# Patient Record
Sex: Male | Born: 1980 | Race: Black or African American | Hispanic: No | Marital: Single | Smoking: Former smoker
Health system: Southern US, Community
[De-identification: ages and names within clinical notes are randomized; demographics above are authoritative.]

## PROBLEM LIST (undated history)

## (undated) DIAGNOSIS — I10 Essential (primary) hypertension: Secondary | ICD-10-CM

## (undated) HISTORY — PX: DENTAL SURGERY: SHX609

---
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
## 2002-05-26 ENCOUNTER — Encounter: Payer: Self-pay | Admitting: Emergency Medicine

## 2002-05-26 ENCOUNTER — Emergency Department (HOSPITAL_COMMUNITY): Admission: EM | Admit: 2002-05-26 | Discharge: 2002-05-26 | Payer: Self-pay | Admitting: Emergency Medicine

## 2002-08-01 ENCOUNTER — Encounter: Payer: Self-pay | Admitting: Emergency Medicine

## 2002-08-01 ENCOUNTER — Emergency Department (HOSPITAL_COMMUNITY): Admission: EM | Admit: 2002-08-01 | Discharge: 2002-08-01 | Payer: Self-pay | Admitting: Emergency Medicine

## 2002-08-17 ENCOUNTER — Emergency Department (HOSPITAL_COMMUNITY): Admission: EM | Admit: 2002-08-17 | Discharge: 2002-08-18 | Payer: Self-pay | Admitting: Emergency Medicine

## 2007-03-02 ENCOUNTER — Emergency Department (HOSPITAL_COMMUNITY): Admission: EM | Admit: 2007-03-02 | Discharge: 2007-03-02 | Payer: Self-pay | Admitting: Emergency Medicine

## 2007-03-07 ENCOUNTER — Emergency Department (HOSPITAL_COMMUNITY): Admission: EM | Admit: 2007-03-07 | Discharge: 2007-03-07 | Payer: Self-pay | Admitting: Emergency Medicine

## 2007-11-20 ENCOUNTER — Emergency Department (HOSPITAL_COMMUNITY): Admission: EM | Admit: 2007-11-20 | Discharge: 2007-11-20 | Payer: Self-pay | Admitting: Pediatrics

## 2009-04-21 ENCOUNTER — Emergency Department (HOSPITAL_COMMUNITY): Admission: EM | Admit: 2009-04-21 | Discharge: 2009-04-21 | Payer: Self-pay | Admitting: Emergency Medicine

## 2009-04-26 ENCOUNTER — Emergency Department (HOSPITAL_COMMUNITY): Admission: EM | Admit: 2009-04-26 | Discharge: 2009-04-26 | Payer: Self-pay | Admitting: Emergency Medicine

## 2010-07-31 ENCOUNTER — Emergency Department (HOSPITAL_COMMUNITY)
Admission: EM | Admit: 2010-07-31 | Discharge: 2010-07-31 | Disposition: A | Discharge status: Home / Self Care | Payer: Self-pay | Attending: Emergency Medicine | Admitting: Emergency Medicine

## 2010-07-31 DIAGNOSIS — R221 Localized swelling, mass and lump, neck: Secondary | ICD-10-CM | POA: Insufficient documentation

## 2010-07-31 DIAGNOSIS — R220 Localized swelling, mass and lump, head: Secondary | ICD-10-CM | POA: Insufficient documentation

## 2010-07-31 DIAGNOSIS — E041 Nontoxic single thyroid nodule: Secondary | ICD-10-CM | POA: Insufficient documentation

## 2010-08-04 ENCOUNTER — Telehealth: Payer: Self-pay | Admitting: Endocrinology

## 2010-08-10 ENCOUNTER — Ambulatory Visit: Payer: Self-pay | Admitting: Endocrinology

## 2010-08-10 DIAGNOSIS — Z0289 Encounter for other administrative examinations: Secondary | ICD-10-CM

## 2010-08-10 NOTE — Progress Notes (Signed)
Summary: NEW ENDO CONSULT  MARCH 8  ---- Converted from flag ---- ---- 08/02/2010 5:18 PM, Minus Breeding MD wrote: ok  ---- 08/02/2010 1:34 PM, Hilarie Fredrickson wrote: Patrick Kennedy WENT TO THE ER AND WAS DX'D WITH A THYROID NODULE.  PT IS SELF PAY.  HE HAS AN APPT. TO EST WITH DR. Jonny Ruiz ON MAY 16 (HIS NEXT AVAIL NEW PT. SLOT) BUT WANT TO SEE AN ENDOCRINOLOGIST PRIOR TO THAT.  DO YOU WANT [place] TO SET HIM UP WITH YOUR NEXT AVAILABLE FOR A NEW PT, EVEN THOUGH HE WON'T SEE DR. JOHN UNTIL MAY?    PHONE: [phone] ------------------------------  Phone Note Call from Patient Call back at Home Phone [phone]   Caller: Patient

## 2010-09-06 LAB — URINE CULTURE
Colony Count: NO GROWTH
Culture: NO GROWTH

## 2010-09-06 LAB — URINALYSIS, ROUTINE W REFLEX MICROSCOPIC
Bilirubin Urine: NEGATIVE
Glucose, UA: NEGATIVE mg/dL
Hgb urine dipstick: NEGATIVE
Ketones, ur: NEGATIVE mg/dL
Nitrite: NEGATIVE
Protein, ur: NEGATIVE mg/dL
Specific Gravity, Urine: 1.02 (ref 1.005–1.030)
Urobilinogen, UA: 1 mg/dL (ref 0.0–1.0)
pH: 7.5 (ref 5.0–8.0)

## 2010-09-06 LAB — URINE MICROSCOPIC-ADD ON

## 2010-09-06 LAB — GC/CHLAMYDIA PROBE AMP, GENITAL: Chlamydia, DNA Probe: POSITIVE — AB

## 2010-09-20 ENCOUNTER — Emergency Department (HOSPITAL_COMMUNITY)
Admission: EM | Admit: 2010-09-20 | Discharge: 2010-09-20 | Disposition: A | Discharge status: Home / Self Care | Payer: Self-pay | Attending: Emergency Medicine | Admitting: Emergency Medicine

## 2010-09-20 DIAGNOSIS — Z202 Contact with and (suspected) exposure to infections with a predominantly sexual mode of transmission: Secondary | ICD-10-CM | POA: Insufficient documentation

## 2010-09-20 LAB — URINALYSIS, ROUTINE W REFLEX MICROSCOPIC
Ketones, ur: NEGATIVE mg/dL
Leukocytes, UA: NEGATIVE
Nitrite: NEGATIVE
Protein, ur: 30 mg/dL — AB
Urobilinogen, UA: 1 mg/dL (ref 0.0–1.0)

## 2010-10-18 ENCOUNTER — Ambulatory Visit: Payer: Self-pay | Admitting: Internal Medicine

## 2010-10-23 ENCOUNTER — Ambulatory Visit: Payer: Self-pay | Admitting: Internal Medicine

## 2010-10-23 DIAGNOSIS — Z0289 Encounter for other administrative examinations: Secondary | ICD-10-CM

## 2011-01-13 ENCOUNTER — Emergency Department (HOSPITAL_COMMUNITY)
Admission: EM | Admit: 2011-01-13 | Discharge: 2011-01-13 | Disposition: A | Discharge status: Home / Self Care | Payer: Self-pay | Attending: Emergency Medicine | Admitting: Emergency Medicine

## 2011-01-13 DIAGNOSIS — Z202 Contact with and (suspected) exposure to infections with a predominantly sexual mode of transmission: Secondary | ICD-10-CM | POA: Insufficient documentation

## 2011-01-13 DIAGNOSIS — R300 Dysuria: Secondary | ICD-10-CM | POA: Insufficient documentation

## 2011-03-15 LAB — GC/CHLAMYDIA PROBE AMP, GENITAL
Chlamydia, DNA Probe: NEGATIVE
GC Probe Amp, Genital: NEGATIVE

## 2011-03-15 LAB — RPR: RPR Ser Ql: NONREACTIVE

## 2011-10-19 ENCOUNTER — Encounter (HOSPITAL_COMMUNITY): Payer: Self-pay | Admitting: Emergency Medicine

## 2011-10-19 ENCOUNTER — Emergency Department (HOSPITAL_COMMUNITY): Payer: Self-pay

## 2011-10-19 ENCOUNTER — Emergency Department (HOSPITAL_COMMUNITY)
Admission: EM | Admit: 2011-10-19 | Discharge: 2011-10-19 | Discharge status: Against Medical Advice | Payer: Self-pay | Attending: Emergency Medicine | Admitting: Emergency Medicine

## 2011-10-19 DIAGNOSIS — T50904A Poisoning by unspecified drugs, medicaments and biological substances, undetermined, initial encounter: Secondary | ICD-10-CM | POA: Insufficient documentation

## 2011-10-19 DIAGNOSIS — R000 Tachycardia, unspecified: Secondary | ICD-10-CM | POA: Insufficient documentation

## 2011-10-19 DIAGNOSIS — T6591XA Toxic effect of unspecified substance, accidental (unintentional), initial encounter: Secondary | ICD-10-CM

## 2011-10-19 DIAGNOSIS — T50901A Poisoning by unspecified drugs, medicaments and biological substances, accidental (unintentional), initial encounter: Secondary | ICD-10-CM | POA: Insufficient documentation

## 2011-10-19 DIAGNOSIS — I10 Essential (primary) hypertension: Secondary | ICD-10-CM | POA: Insufficient documentation

## 2011-10-19 HISTORY — DX: Essential (primary) hypertension: I10

## 2011-10-19 MED ORDER — LORAZEPAM 2 MG/ML IJ SOLN: 1.0000 mg | Freq: Once | INTRAMUSCULAR | Status: DC

## 2011-10-19 MED ORDER — SODIUM CHLORIDE 0.9 % IV BOLUS (SEPSIS): 1000.0000 mL | Freq: Once | INTRAVENOUS | Status: DC

## 2011-10-19 NOTE — ED Notes (Addendum)
Patient states that the [employer] brought him here because they think he took narcotics and they want to recover the drugs. Patient denies N/V/D/F. Patient denies chest pain or SOB. Patient states his BP is elevated from tooth pain and drinking power drinks and talking "Man Up" pills. Patient placed on monitor and sats od 99% RA.  [employer] at bedside.

## 2011-10-19 NOTE — ED Notes (Signed)
Patient refuses to have blood drawn for lab tests and refuses IV fluids and medications. [employer] at bedside and patient is hancuffed to bed.

## 2011-10-19 NOTE — ED Notes (Signed)
Patient left AMA. [employer] released patient.

## 2011-10-19 NOTE — ED Notes (Signed)
Per [employer], pt swallowed unknown narcotic about 1.5 hours ago; [employer] escorted pt; pt ambulatory, and a&oX4; charge RN made aware of pt's arrival

## 2011-10-19 NOTE — ED Provider Notes (Signed)
History     CSN: [id number]  Arrival date & time 10/19/11  1702   First MD Initiated Contact with Patient 10/19/11 1807      Chief Complaint  Patient presents with  . Drug Overdose    (Consider location/radiation/quality/duration/timing/severity/associated sxs/prior treatment) Patient is a 31 y.o. male presenting with Overdose.  Drug Overdose This is a new problem. Episode onset: approx 3:30 pm. Episode frequency: once. The problem has been unchanged. Pertinent negatives include no abdominal pain, chest pain, coughing, fever, headaches, nausea, neck pain, numbness or vomiting. Associated symptoms comments: Tachycardia, hypertension. The symptoms are aggravated by nothing. He has tried nothing for the symptoms. The treatment provided no relief.    Past Medical History  Diagnosis Date  . Hypertension     Past Surgical History  Procedure Date  . Dental surgery     History reviewed. No pertinent family history.  History  Substance Use Topics  . Smoking status: Former Games developer  . Smokeless tobacco: Not on file  . Alcohol Use: No      Review of Systems  Constitutional: Negative for fever.  HENT: Negative for rhinorrhea, drooling and neck pain.   Eyes: Negative for pain.  Respiratory: Negative for cough and shortness of breath.   Cardiovascular: Negative for chest pain and leg swelling.  Gastrointestinal: Negative for nausea, vomiting, abdominal pain and diarrhea.  Genitourinary: Negative for dysuria and hematuria.  Musculoskeletal: Negative for gait problem.  Skin: Negative for color change.  Neurological: Negative for numbness and headaches.  Hematological: Negative for adenopathy.  Psychiatric/Behavioral: Negative for behavioral problems.  All other systems reviewed and are negative.    Allergies  Review of patient's allergies indicates no known allergies.  Home Medications   Current Outpatient Rx  Name Route Sig Dispense Refill  .  PSEUDOEPHEDRINE-GUAIFENESIN ER 60-600 MG PO TB12 Oral Take 1 tablet by mouth every 12 (twelve) hours.      BP 186/74  Pulse 145  Temp(Src) 100.7 F (38.2 C) (Oral)  Resp 18  SpO2 100%  Physical Exam  Constitutional: He is oriented to person, place, and time. He appears well-developed and well-nourished.  HENT:  Head: Normocephalic and atraumatic.  Right Ear: External ear normal.  Left Ear: External ear normal.  Nose: Nose normal.  Mouth/Throat: Oropharynx is clear and moist. No oropharyngeal exudate.  Eyes: Conjunctivae and EOM are normal. Pupils are equal, round, and reactive to light.  Neck: Normal range of motion. Neck supple.  Cardiovascular: Normal heart sounds and intact distal pulses.  Exam reveals no gallop and no friction rub.   No murmur heard.      Sinus tachycardia  Pulmonary/Chest: Effort normal and breath sounds normal. No respiratory distress. He has no wheezes.  Abdominal: Soft. Bowel sounds are normal. He exhibits no distension. There is no tenderness.  Musculoskeletal: Normal range of motion. He exhibits no edema and no tenderness.  Neurological: He is alert and oriented to person, place, and time.  Skin: Skin is warm and dry.  Psychiatric: He has a normal mood and affect. His behavior is normal.    ED Course  Procedures (including critical care time)   Labs Reviewed  CBC  DIFFERENTIAL  COMPREHENSIVE METABOLIC PANEL  PROTIME-INR  SALICYLATE LEVEL  ACETAMINOPHEN LEVEL  LACTIC ACID, PLASMA  URINE RAPID DRUG SCREEN (HOSP PERFORMED)   No results found.   No diagnosis found.   Date: 10/20/2011  Rate: 135  Rhythm: sinus tachycardia  QRS Axis: normal  Intervals: QT prolonged  ST/T  Wave abnormalities: nonspecific ST/T changes  Conduction Disutrbances:none  Narrative Interpretation: Flipped t waves in the inferior leads are non-specific  Old EKG Reviewed: none available    MDM  6:22 PM 31 y.o. male pw concern for drug ingestion. Pt was pulled  over by police. He then ran into a convenience store and was seen on the security camera ingesting something. The pt has no complaints, but is tachycardic in the 130's, hypertensive sys 170's on exam. Will get labs, imaging. Will give ativan. Pt does admit to using marijuana, cocaine in last few days. Denies ingestion or use of drugs today.   Pt wants to leave AMA. I notified him of the risks and consequences including death and permanent disability. Pt understands and would like to leave. I have discussed the diagnosis/risks/treatment options with the patient and believe the pt to be eligible for discharge home to follow-up with his pcp as needed. We also discussed returning to the ED immediately if new or worsening sx occur. We discussed the sx which are most concerning (e.g., sob, cp, continued palpitations) that necessitate immediate return. Any new prescriptions provided to the patient are listed below.  New Prescriptions   No medications on file   Clinical Impression 1. Ingestion of substance          Purvis Sheffield, MD 10/20/11 [phone]

## 2011-10-19 NOTE — ED Notes (Signed)
EkG handed to Dr. Bebe Shaggy.  Extra copy placed in pt chart, no old in MUSE

## 2011-10-21 NOTE — ED Provider Notes (Signed)
I have personally seen and examined the patient.  I have discussed the plan of care with the resident.  I have reviewed the documentation on PMH/FH/Soc. History.  I have reviewed the documentation of the resident and agree.  I have reviewed and agree with the ECG interpretation(s) documented by the resident.  Pt seen with resident, pt brought in by police but they are unable to hold him any longer Pt refuses workup, and he is mentating appropriately.  Risks of leaving discussed with patient  Joya Gaskins, MD 10/21/11 [phone]

## 2012-11-29 ENCOUNTER — Emergency Department (HOSPITAL_COMMUNITY)
Admission: EM | Admit: 2012-11-29 | Discharge: 2012-11-29 | Disposition: A | Discharge status: Home / Self Care | Payer: Self-pay | Attending: Emergency Medicine | Admitting: Emergency Medicine

## 2012-11-29 ENCOUNTER — Encounter (HOSPITAL_COMMUNITY): Payer: Self-pay | Admitting: Physical Medicine and Rehabilitation

## 2012-11-29 DIAGNOSIS — Z118 Encounter for screening for other infectious and parasitic diseases: Secondary | ICD-10-CM | POA: Insufficient documentation

## 2012-11-29 DIAGNOSIS — Z87891 Personal history of nicotine dependence: Secondary | ICD-10-CM | POA: Insufficient documentation

## 2012-11-29 DIAGNOSIS — Z202 Contact with and (suspected) exposure to infections with a predominantly sexual mode of transmission: Secondary | ICD-10-CM

## 2012-11-29 DIAGNOSIS — I10 Essential (primary) hypertension: Secondary | ICD-10-CM | POA: Insufficient documentation

## 2012-11-29 LAB — URINALYSIS, ROUTINE W REFLEX MICROSCOPIC
Hgb urine dipstick: NEGATIVE
Leukocytes, UA: NEGATIVE
Nitrite: NEGATIVE
Protein, ur: NEGATIVE mg/dL
Urobilinogen, UA: 1 mg/dL (ref 0.0–1.0)

## 2012-11-29 MED ORDER — LIDOCAINE HCL (PF) 1 % IJ SOLN
INTRAMUSCULAR | Status: AC
  Administered 2012-11-29: 0.9 mL
  Filled 2012-11-29: qty 5

## 2012-11-29 MED ORDER — AZITHROMYCIN 250 MG PO TABS
1000.0000 mg | ORAL_TABLET | Freq: Once | ORAL | Status: AC
  Administered 2012-11-29: 1000 mg via ORAL
  Filled 2012-11-29: qty 4

## 2012-11-29 MED ORDER — METRONIDAZOLE 500 MG PO TABS: 500.0000 mg | ORAL_TABLET | Freq: Two times a day (BID) | ORAL | Status: DC

## 2012-11-29 MED ORDER — CEFTRIAXONE SODIUM 250 MG IJ SOLR
250.0000 mg | Freq: Once | INTRAMUSCULAR | Status: AC
  Administered 2012-11-29: 250 mg via INTRAMUSCULAR
  Filled 2012-11-29: qty 250

## 2012-11-29 NOTE — ED Notes (Signed)
Pt presents to department for evaluation of possible STD. States he received phone call from partner stating that he could have STD. States burning with urination. Denies penile discharge. No abdominal pain.

## 2012-11-29 NOTE — ED Provider Notes (Signed)
History    CSN: [id number] Arrival date & time 11/29/12  1007  First MD Initiated Contact with Patient 11/29/12 1018     Chief Complaint  Patient presents with  . Dysuria  . SEXUALLY TRANSMITTED DISEASE   (Consider location/radiation/quality/duration/timing/severity/associated sxs/prior Treatment) HPI Comments: Patient presents with complaint of recent exposure to Trichomonas. Patient states that he has had some tingling with urination since he was told this. He denies discharge. He denies lesions, fever. He does have history of sexual transmitted disease. He is requesting testing and treatment. Onset of symptoms gradual. Course is constant. Nothing makes symptoms better or worse.  Patient is a 32 y.o. male presenting with dysuria. The history is provided by the patient.  Dysuria Pertinent negatives include no arthralgias, fever, rash or sore throat.   Past Medical History  Diagnosis Date  . Hypertension    Past Surgical History  Procedure Laterality Date  . Dental surgery     History reviewed. No pertinent family history. History  Substance Use Topics  . Smoking status: Former Games developer  . Smokeless tobacco: Not on file  . Alcohol Use: Yes     Comment: social    Review of Systems  Constitutional: Negative for fever.  HENT: Negative for sore throat.   Eyes: Negative for discharge.  Gastrointestinal: Negative for rectal pain.  Genitourinary: Positive for dysuria. Negative for frequency, discharge, genital sores, penile pain and testicular pain.  Musculoskeletal: Negative for arthralgias.  Skin: Negative for rash.  Hematological: Negative for adenopathy.    Allergies  Review of patient's allergies indicates no known allergies.  Home Medications   Current Outpatient Rx  Name  Route  Sig  Dispense  Refill  . pseudoephedrine-guaifenesin (MUCINEX D) 60-600 MG per tablet   Oral   Take 1 tablet by mouth every 12 (twelve) hours.          BP 142/83  Pulse 92   Temp(Src) 98.7 F (37.1 C) (Oral)  Resp 18  SpO2 100% Physical Exam  Nursing note and vitals reviewed. Constitutional: He appears well-developed and well-nourished.  HENT:  Head: Normocephalic and atraumatic.  Eyes: Conjunctivae are normal.  Neck: Normal range of motion. Neck supple.  Pulmonary/Chest: No respiratory distress.  Abdominal: There is no tenderness. There is no CVA tenderness.  Genitourinary: Testes normal and penis normal. Circumcised. No penile erythema. No discharge found.  Neurological: He is alert.  Skin: Skin is warm and dry.  Psychiatric: He has a normal mood and affect.    ED Course  Procedures (including critical care time) Labs Reviewed  GC/CHLAMYDIA PROBE AMP  URINALYSIS, ROUTINE W REFLEX MICROSCOPIC   No results found. 1. Exposure to STD     10:25 AM Patient seen and examined. Work-up initiated. Medications ordered.   Vital signs reviewed and are as follows: Filed Vitals:   11/29/12 1014  BP: 142/83  Pulse: 92  Temp: 98.7 F (37.1 C)  Resp: 18   UA does not show Trichomonas however patient treated. Will test and treat for gonorrhea/Chlamydia exposure at patient request. Patient counseled on safe sexual practices. Told them that they should not have sexual contact for next 7 days and that they need to inform sexual partners so that they can get tested and treated as well.  Urged f/u with [place] [facility] for HIV and syphilis testing. Patient verbalizes understanding and agrees with plan.     MDM  Exposure to Trichomonas. Patient has had some dysuria. UA negative. Patient treated for Trichomonas  and empirically for gonorrhea and Chlamydia. He appears well. Patient counseled and given health department followup.  Renne Crigler, PA-C 11/29/12 1535

## 2012-11-30 LAB — GC/CHLAMYDIA PROBE AMP
CT Probe RNA: NEGATIVE
GC Probe RNA: NEGATIVE

## 2012-12-02 NOTE — ED Provider Notes (Signed)
Medical screening examination/treatment/procedure(s) were performed by non-physician practitioner and as supervising physician I was immediately available for consultation/collaboration. Devoria Albe, MD, Armando Gang   Ward Givens, MD 12/02/12 [phone]

## 2013-09-07 ENCOUNTER — Other Ambulatory Visit: Payer: Self-pay

## 2017-07-24 ENCOUNTER — Encounter (HOSPITAL_COMMUNITY): Payer: Self-pay | Admitting: Family Medicine

## 2017-07-24 ENCOUNTER — Emergency Department (HOSPITAL_COMMUNITY)
Admission: EM | Admit: 2017-07-24 | Discharge: 2017-07-24 | Disposition: A | Discharge status: Home / Self Care | Payer: Self-pay | Attending: Emergency Medicine | Admitting: Emergency Medicine

## 2017-07-24 DIAGNOSIS — I10 Essential (primary) hypertension: Secondary | ICD-10-CM | POA: Insufficient documentation

## 2017-07-24 DIAGNOSIS — B9789 Other viral agents as the cause of diseases classified elsewhere: Secondary | ICD-10-CM | POA: Insufficient documentation

## 2017-07-24 DIAGNOSIS — R0981 Nasal congestion: Secondary | ICD-10-CM | POA: Insufficient documentation

## 2017-07-24 DIAGNOSIS — R059 Cough, unspecified: Secondary | ICD-10-CM

## 2017-07-24 DIAGNOSIS — J029 Acute pharyngitis, unspecified: Secondary | ICD-10-CM | POA: Insufficient documentation

## 2017-07-24 DIAGNOSIS — F17210 Nicotine dependence, cigarettes, uncomplicated: Secondary | ICD-10-CM | POA: Insufficient documentation

## 2017-07-24 DIAGNOSIS — J069 Acute upper respiratory infection, unspecified: Secondary | ICD-10-CM | POA: Insufficient documentation

## 2017-07-24 DIAGNOSIS — R05 Cough: Secondary | ICD-10-CM

## 2017-07-24 MED ORDER — IBUPROFEN 800 MG PO TABS: 800.0000 mg | ORAL_TABLET | Freq: Three times a day (TID) | ORAL | 0 refills | Status: DC

## 2017-07-24 MED ORDER — FLUTICASONE PROPIONATE 50 MCG/ACT NA SUSP: 2.0000 | Freq: Every day | NASAL | 2 refills | Status: DC

## 2017-07-24 MED ORDER — BENZONATATE 100 MG PO CAPS: 100.0000 mg | ORAL_CAPSULE | Freq: Three times a day (TID) | ORAL | 0 refills | Status: DC

## 2017-07-24 NOTE — ED Triage Notes (Signed)
Patient reports he has been experiencing chills, productive cough, sore throat, intermittent nasal drainage/congestion, and headache. Symptoms have been occurring for the last 2 weeks. Taking Nyquil with mild relief.

## 2017-07-24 NOTE — Discharge Instructions (Signed)

## 2017-07-24 NOTE — ED Provider Notes (Signed)
Emergency Department Provider Note   I have reviewed the triage vital signs and the nursing notes.   HISTORY  Chief Complaint URI   HPI Patrick Kennedy is a 37 y.o. male with PMH of HTN presents to the ED with with 2 weeks of cough, sore throat, runny nose, and fatigue. No sick contacts. Denies body aches. He is experiencing some sinus pressure as well. No radiation or symptoms or modifying. Symptoms are constant with no modifying factors. Denies any difficulty breathing. No vomiting or diarrhea.    Past Medical History:  Diagnosis Date  . Hypertension     There are no active problems to display for this patient.   Past Surgical History:  Procedure Laterality Date  . DENTAL SURGERY      Current Outpatient Rx  . Order #: [id number] Class: Historical Med  . Order #: [id number] Class: Print  . Order #: [id number] Class: Print  . Order #: [id number] Class: Print  . Order #: [id number] Class: Print    Allergies Patient has no known allergies.  History reviewed. No pertinent family history.  Social History Social History   Tobacco Use  . Smoking status: Current Every Day Smoker  . Smokeless tobacco: Never Used  Substance Use Topics  . Alcohol use: Yes    Comment: 4-5 times a week.   . Drug use: No    Comment: Hx    Review of Systems  Constitutional: No fever/chills. Positive fatigue.  Eyes: No visual changes. ENT: Positive sore throat. Positive face pressure.  Cardiovascular: Denies chest pain. Respiratory: Denies shortness of breath. Positive cough.  Gastrointestinal: No abdominal pain.  No nausea, no vomiting.  No diarrhea.  No constipation. Genitourinary: Negative for dysuria. Musculoskeletal: Negative for back pain. Skin: Negative for rash. Neurological: Negative for focal weakness or numbness. Positive mild HA.   10-point ROS otherwise negative.  ____________________________________________   PHYSICAL EXAM:  VITAL SIGNS: ED Triage Vitals  Enc  Vitals Group     BP 07/24/17 2208 (!) 149/82     Pulse Rate 07/24/17 2208 77     Resp 07/24/17 2208 18     Temp 07/24/17 2208 98.2 F (36.8 C)     Temp Source 07/24/17 2208 Oral     SpO2 07/24/17 2208 99 %     Weight 07/24/17 2213 220 lb (99.8 kg)     Height 07/24/17 2213 6\' 4"  (1.93 m)     Pain Score 07/24/17 2213 6   Constitutional: Alert and oriented. Well appearing and in no acute distress. Eyes: Conjunctivae are normal.  Head: Atraumatic. Nose: Mild congestion/rhinnorhea. No tenderness to palpation of the frontal or maxillary sinuses.  Mouth/Throat: Mucous membranes are moist.  Oropharynx non-erythematous. Neck: No stridor.   Cardiovascular: Normal rate, regular rhythm. Good peripheral circulation. Grossly normal heart sounds.   Respiratory: Normal respiratory effort.  No retractions. Lungs CTAB. Gastrointestinal: No distention.  Musculoskeletal: No gross deformities of extremities. Neurologic:  Normal speech and language. No gross focal neurologic deficits are appreciated.  Skin:  Skin is warm, dry and intact. No rash noted. ____________________________________________   PROCEDURES  Procedure(s) performed:   Procedures  None ____________________________________________   INITIAL IMPRESSION / ASSESSMENT AND PLAN / ED COURSE  Pertinent labs & imaging results that were available during my care of the patient were reviewed by me and considered in my medical decision making (see chart for details).  Patient presents to the ED for evaluation of nasal congestion, sore throat, and other URI symptoms. No flu clinically  and with 2 weeks of symptoms patient would not be a Tamiflu candidate. DIscussed supportive care and prescribed medication to assist with this.   At this time, I do not feel there is any life-threatening condition present. I have reviewed and discussed all results and exam findings with patient. I have reviewed nursing notes and appropriate previous records.  I  feel the patient is safe to be discharged home without further emergent workup. Discussed usual and customary return precautions. Patient verbalizes understanding and is comfortable with this plan.  Patient will follow-up with their primary care provider. If they do not have a primary care provider, information for follow-up has been provided to them. All questions have been answered.    ____________________________________________  FINAL CLINICAL IMPRESSION(S) / ED DIAGNOSES  Final diagnoses:  Viral upper respiratory tract infection  Cough    NEW OUTPATIENT MEDICATIONS STARTED DURING THIS VISIT:  Discharge Medication List as of 07/24/2017 10:44 PM    START taking these medications   Details  benzonatate (TESSALON) 100 MG capsule Take 1 capsule (100 mg total) by mouth every 8 (eight) hours., Starting Wed 07/24/2017, Print    fluticasone (FLONASE) 50 MCG/ACT nasal spray Place 2 sprays into both nostrils daily., Starting Wed 07/24/2017, Print    ibuprofen (ADVIL,MOTRIN) 800 MG tablet Take 1 tablet (800 mg total) by mouth 3 (three) times daily., Starting Wed 07/24/2017, Print        Note:  This document was prepared using Dragon voice recognition software and may include unintentional dictation errors.  Alona BeneJoshua Maicol Bowland, MD Emergency Medicine    Nickola Lenig, Arlyss RepressJoshua G, MD 07/25/17 [phone]

## 2017-09-02 ENCOUNTER — Encounter (HOSPITAL_COMMUNITY): Payer: Self-pay | Admitting: Emergency Medicine

## 2017-09-02 ENCOUNTER — Other Ambulatory Visit: Payer: Self-pay

## 2017-09-02 DIAGNOSIS — R112 Nausea with vomiting, unspecified: Secondary | ICD-10-CM | POA: Insufficient documentation

## 2017-09-02 DIAGNOSIS — R197 Diarrhea, unspecified: Secondary | ICD-10-CM | POA: Insufficient documentation

## 2017-09-02 DIAGNOSIS — Z87891 Personal history of nicotine dependence: Secondary | ICD-10-CM | POA: Insufficient documentation

## 2017-09-02 DIAGNOSIS — I10 Essential (primary) hypertension: Secondary | ICD-10-CM | POA: Insufficient documentation

## 2017-09-02 DIAGNOSIS — Z79899 Other long term (current) drug therapy: Secondary | ICD-10-CM | POA: Insufficient documentation

## 2017-09-02 NOTE — ED Triage Notes (Signed)
Pt c/o Abd pain x 24 hours + n/v/d. Multiple times in last 24 hours.

## 2017-09-03 ENCOUNTER — Emergency Department (HOSPITAL_COMMUNITY)
Admission: EM | Admit: 2017-09-03 | Discharge: 2017-09-03 | Disposition: A | Discharge status: Home / Self Care | Payer: Self-pay | Attending: Emergency Medicine | Admitting: Emergency Medicine

## 2017-09-03 DIAGNOSIS — R112 Nausea with vomiting, unspecified: Secondary | ICD-10-CM

## 2017-09-03 DIAGNOSIS — R197 Diarrhea, unspecified: Secondary | ICD-10-CM

## 2017-09-03 LAB — COMPREHENSIVE METABOLIC PANEL
ALT: 19 U/L (ref 17–63)
AST: 18 U/L (ref 15–41)
Albumin: 3.9 g/dL (ref 3.5–5.0)
Alkaline Phosphatase: 48 U/L (ref 38–126)
Anion gap: 10 (ref 5–15)
BILIRUBIN TOTAL: 0.8 mg/dL (ref 0.3–1.2)
BUN: 10 mg/dL (ref 6–20)
CALCIUM: 8.9 mg/dL (ref 8.9–10.3)
CO2: 24 mmol/L (ref 22–32)
CREATININE: 1.17 mg/dL (ref 0.61–1.24)
Chloride: 105 mmol/L (ref 101–111)
Glucose, Bld: 86 mg/dL (ref 65–99)
Potassium: 3.5 mmol/L (ref 3.5–5.1)
Sodium: 139 mmol/L (ref 135–145)
TOTAL PROTEIN: 7.2 g/dL (ref 6.5–8.1)

## 2017-09-03 LAB — URINALYSIS, ROUTINE W REFLEX MICROSCOPIC
Bilirubin Urine: NEGATIVE
GLUCOSE, UA: NEGATIVE mg/dL
Hgb urine dipstick: NEGATIVE
KETONES UR: NEGATIVE mg/dL
LEUKOCYTES UA: NEGATIVE
NITRITE: NEGATIVE
PROTEIN: NEGATIVE mg/dL
Specific Gravity, Urine: 1.031 — ABNORMAL HIGH (ref 1.005–1.030)
pH: 5 (ref 5.0–8.0)

## 2017-09-03 LAB — CBC
HCT: 47.7 % (ref 39.0–52.0)
Hemoglobin: 15.9 g/dL (ref 13.0–17.0)
MCH: 29.8 pg (ref 26.0–34.0)
MCHC: 33.3 g/dL (ref 30.0–36.0)
MCV: 89.5 fL (ref 78.0–100.0)
PLATELETS: 209 10*3/uL (ref 150–400)
RBC: 5.33 MIL/uL (ref 4.22–5.81)
RDW: 12.8 % (ref 11.5–15.5)
WBC: 4.7 10*3/uL (ref 4.0–10.5)

## 2017-09-03 LAB — LIPASE, BLOOD: Lipase: 41 U/L (ref 11–51)

## 2017-09-03 MED ORDER — ONDANSETRON 4 MG PO TBDP: 4.0000 mg | ORAL_TABLET | Freq: Three times a day (TID) | ORAL | 0 refills | Status: DC | PRN

## 2017-09-03 NOTE — ED Notes (Signed)
Patient Alert and oriented to baseline. Stable and ambulatory to baseline. Patient verbalized understanding of the discharge instructions.  Patient belongings were taken by the patient.   

## 2017-09-03 NOTE — ED Provider Notes (Signed)
[facility] EMERGENCY DEPARTMENT Provider Note   CSN: [id number] Arrival date & time: 09/02/17  2318     History   Chief Complaint Chief Complaint  Patient presents with  . Abdominal Pain    HPI Patrick Kennedy is a 37 y.o. male.  Patient presents to the emergency department with a chief complaint of nausea, vomiting, diarrhea.  He reports associated crampy abdominal pain.  He states the symptoms started 2 days ago.  He denies any fevers or chills.  He has not taken anything for his symptoms.  He reports associated sick contacts.  He denies any other associated symptoms.  The history is provided by the patient. No language interpreter was used.    Past Medical History:  Diagnosis Date  . Hypertension     There are no active problems to display for this patient.   Past Surgical History:  Procedure Laterality Date  . DENTAL SURGERY          Home Medications    Prior to Admission medications   Medication Sig Start Date End Date Taking? Authorizing Provider  benzonatate (TESSALON) 100 MG capsule Take 1 capsule (100 mg total) by mouth every 8 (eight) hours. 07/24/17   Long, Arlyss Repress, MD  DM-Doxylamine-Acetaminophen (NYQUIL COLD & FLU PO) Take 10 mLs by mouth at bedtime as needed.    [provider]  fluticasone (FLONASE) 50 MCG/ACT nasal spray Place 2 sprays into both nostrils daily. 07/24/17   Long, Arlyss Repress, MD  ibuprofen (ADVIL,MOTRIN) 800 MG tablet Take 1 tablet (800 mg total) by mouth 3 (three) times daily. 07/24/17   Long, Arlyss Repress, MD  metroNIDAZOLE (FLAGYL) 500 MG tablet Take 1 tablet (500 mg total) by mouth 2 (two) times daily. Patient not taking: Reported on 07/24/2017 11/29/12   Renne Crigler, PA-C  ondansetron (ZOFRAN ODT) 4 MG disintegrating tablet Take 1 tablet (4 mg total) by mouth every 8 (eight) hours as needed for nausea or vomiting. 09/03/17   Roxy Horseman, PA-C    Family History No family history on file.  Social  History Social History   Tobacco Use  . Smoking status: Former Smoker    Last attempt to quit: 07/29/2017    Years since quitting: 0.0  . Smokeless tobacco: Never Used  Substance Use Topics  . Alcohol use: Yes    Comment: 4-5 times a week.   . Drug use: No     Allergies   Patient has no known allergies.   Review of Systems Review of Systems  All other systems reviewed and are negative.    Physical Exam Updated Vital Signs BP 122/69   Pulse 73   Temp 98.5 F (36.9 C)   Resp 17   Ht 6\' 4"  (1.93 m)   Wt 98.4 kg (217 lb)   SpO2 100%   BMI 26.41 kg/m   Physical Exam  Constitutional: He is oriented to person, place, and time. He appears well-developed and well-nourished.  HENT:  Head: Normocephalic and atraumatic.  Eyes: Pupils are equal, round, and reactive to light. Conjunctivae and EOM are normal. Right eye exhibits no discharge. Left eye exhibits no discharge. No scleral icterus.  Neck: Normal range of motion. Neck supple. No JVD present.  Cardiovascular: Normal rate, regular rhythm and normal heart sounds. Exam reveals no gallop and no friction rub.  No murmur heard. Pulmonary/Chest: Effort normal and breath sounds normal. No respiratory distress. He has no wheezes. He has no rales. He exhibits  no tenderness.  Abdominal: Soft. He exhibits no distension and no mass. There is no tenderness. There is no rebound and no guarding.  No focal abdominal tenderness, no RLQ tenderness or pain at McBurney's point, no RUQ tenderness or Murphy's sign, no left-sided abdominal tenderness, no fluid wave, or signs of peritonitis   Musculoskeletal: Normal range of motion. He exhibits no edema or tenderness.  Neurological: He is alert and oriented to person, place, and time.  Skin: Skin is warm and dry.  Psychiatric: He has a normal mood and affect. His behavior is normal. Judgment and thought content normal.  Nursing note and vitals reviewed.    ED Treatments / Results   Labs (all labs ordered are listed, but only abnormal results are displayed) Labs Reviewed  URINALYSIS, ROUTINE W REFLEX MICROSCOPIC - Abnormal; Notable for the following components:      Result Value   Specific Gravity, Urine 1.031 (*)    All other components within normal limits  LIPASE, BLOOD  COMPREHENSIVE METABOLIC PANEL  CBC    EKG None  Radiology No results found.  Procedures Procedures (including critical care time)  Medications Ordered in ED Medications - No data to display   Initial Impression / Assessment and Plan / ED Course  I have reviewed the triage vital signs and the nursing notes.  Pertinent labs & imaging results that were available during my care of the patient were reviewed by me and considered in my medical decision making (see chart for details).     Patient with nausea, vomiting, diarrhea.  He also reports crampy abdominal pain times 2 days.  He has been around other sick contacts with similar symptoms.  His abdominal exam is benign, he has no focal abdominal tenderness.  Doubt surgical or acute abdomen.  Laboratory workup is reassuring.  His vital signs are normal.  He is well-appearing.  He would like to be discharged with a work note and something for nausea.  Final Clinical Impressions(s) / ED Diagnoses   Final diagnoses:  Nausea vomiting and diarrhea    ED Discharge Orders        Ordered    ondansetron (ZOFRAN ODT) 4 MG disintegrating tablet  Every 8 hours PRN     09/03/17 0203       Roxy HorsemanBrowning, Lanier Felty, PA-C 09/03/17 0206    Dione BoozeGlick, David, MD 09/03/17 [phone]

## 2020-06-08 ENCOUNTER — Emergency Department (HOSPITAL_COMMUNITY)
Admission: EM | Admit: 2020-06-08 | Discharge: 2020-06-09 | Disposition: A | Discharge status: Home / Self Care | Payer: Self-pay | Attending: Emergency Medicine | Admitting: Emergency Medicine

## 2020-06-08 ENCOUNTER — Encounter (HOSPITAL_COMMUNITY): Payer: Self-pay | Admitting: *Deleted

## 2020-06-08 ENCOUNTER — Other Ambulatory Visit: Payer: Self-pay

## 2020-06-08 DIAGNOSIS — Z87891 Personal history of nicotine dependence: Secondary | ICD-10-CM | POA: Insufficient documentation

## 2020-06-08 DIAGNOSIS — I10 Essential (primary) hypertension: Secondary | ICD-10-CM | POA: Insufficient documentation

## 2020-06-08 DIAGNOSIS — J069 Acute upper respiratory infection, unspecified: Secondary | ICD-10-CM | POA: Insufficient documentation

## 2020-06-08 DIAGNOSIS — R072 Precordial pain: Secondary | ICD-10-CM | POA: Insufficient documentation

## 2020-06-08 DIAGNOSIS — Z20822 Contact with and (suspected) exposure to covid-19: Secondary | ICD-10-CM | POA: Insufficient documentation

## 2020-06-08 NOTE — ED Triage Notes (Signed)
Has not had vaccines

## 2020-06-08 NOTE — ED Triage Notes (Signed)
The pt is c/o a cold cough chest pain sorethroat for 2 weeks

## 2020-06-09 ENCOUNTER — Emergency Department (HOSPITAL_COMMUNITY): Payer: Self-pay

## 2020-06-09 LAB — BASIC METABOLIC PANEL
Anion gap: 12 (ref 5–15)
BUN: 10 mg/dL (ref 6–20)
CO2: 24 mmol/L (ref 22–32)
Calcium: 9.5 mg/dL (ref 8.9–10.3)
Chloride: 101 mmol/L (ref 98–111)
Creatinine, Ser: 1.06 mg/dL (ref 0.61–1.24)
GFR, Estimated: >60 mL/min (ref >60)
Glucose, Bld: 106 mg/dL — ABNORMAL HIGH (ref 70–99)
Potassium: 3.6 mmol/L (ref 3.5–5.1)
Sodium: 137 mmol/L (ref 135–145)

## 2020-06-09 LAB — SARS CORONAVIRUS 2 (TAT 6-24 HRS): SARS Coronavirus 2: NEGATIVE

## 2020-06-09 LAB — TROPONIN I (HIGH SENSITIVITY): Troponin I (High Sensitivity): 4 ng/L (ref <18)

## 2020-06-09 LAB — CBC WITH DIFFERENTIAL/PLATELET
Abs Immature Granulocytes: 0.02 10*3/uL (ref 0.00–0.07)
Basophils Absolute: 0.1 10*3/uL (ref 0.0–0.1)
Basophils Relative: 1 %
Eosinophils Absolute: 0.1 10*3/uL (ref 0.0–0.5)
Eosinophils Relative: 1 %
HCT: 48.6 % (ref 39.0–52.0)
Hemoglobin: 15.8 g/dL (ref 13.0–17.0)
Immature Granulocytes: 0 %
Lymphocytes Relative: 28 %
Lymphs Abs: 2 10*3/uL (ref 0.7–4.0)
MCH: 28.8 pg (ref 26.0–34.0)
MCHC: 32.5 g/dL (ref 30.0–36.0)
MCV: 88.5 fL (ref 80.0–100.0)
Monocytes Absolute: 0.6 10*3/uL (ref 0.1–1.0)
Monocytes Relative: 8 %
Neutro Abs: 4.5 10*3/uL (ref 1.7–7.7)
Neutrophils Relative %: 62 %
Platelets: 278 10*3/uL (ref 150–400)
RBC: 5.49 MIL/uL (ref 4.22–5.81)
RDW: 12.2 % (ref 11.5–15.5)
WBC: 7.2 10*3/uL (ref 4.0–10.5)
nRBC: 0 % (ref 0.0–0.2)

## 2020-06-09 LAB — POC SARS CORONAVIRUS 2 AG -  ED: SARS Coronavirus 2 Ag: NEGATIVE

## 2020-06-09 MED ORDER — IBUPROFEN 800 MG PO TABS: 800.0000 mg | ORAL_TABLET | Freq: Three times a day (TID) | ORAL | 0 refills | Status: DC | PRN

## 2020-06-09 MED ORDER — ALBUTEROL SULFATE HFA 108 (90 BASE) MCG/ACT IN AERS: 1.0000 | INHALATION_SPRAY | Freq: Four times a day (QID) | RESPIRATORY_TRACT | 0 refills | Status: DC | PRN

## 2020-06-09 MED ORDER — BENZONATATE 100 MG PO CAPS: 100.0000 mg | ORAL_CAPSULE | Freq: Three times a day (TID) | ORAL | 0 refills | Status: DC | PRN

## 2020-06-09 NOTE — Discharge Instructions (Signed)
You were seen in the emergency room today with chest discomfort and upper respiratory infection symptoms.  We are testing you for COVID-19 with a PCR test.  The results will come back in the MyChart app and 6 to 24 hours.  Please remain in quarantine until your test comes back negative and you are feeling better for at least 2 days.  You can use the medications called into your pharmacy to help with symptoms.  If you develop new or worsening chest pain you should return to the emergency department, otherwise, please follow with your primary care doctor who can manage and refer her to a specialist if needed.

## 2020-06-09 NOTE — ED Provider Notes (Signed)
Emergency Department Provider Note   I have reviewed the triage vital signs and the nursing notes.   HISTORY  Chief Complaint Chest Pain   HPI Patrick Kennedy is a 40 y.o. male with past medical history of hypertension presents to the emergency department with cough, congestion, sore throat intermittently over the past 2 weeks.  He has had some developing chest discomfort the last several days which is concerning him.  He does have pain with coughing but also having some more constant chest pressure/tightness.   No clear modifying factors.  Pain is centered to left-sided and intermittently worse.  No diaphoresis.  No syncope.  Patient has not been tested for Covid. Patient not vaccinated.   Past Medical History:  Diagnosis Date  . Hypertension     There are no problems to display for this patient.   Past Surgical History:  Procedure Laterality Date  . DENTAL SURGERY      Allergies Patient has no known allergies.  No family history on file.  Social History Social History   Tobacco Use  . Smoking status: Former Smoker    Quit date: 07/29/2017    Years since quitting: 2.8  . Smokeless tobacco: Never Used  Vaping Use  . Vaping Use: Never used  Substance Use Topics  . Alcohol use: Yes    Comment: 4-5 times a week.   . Drug use: No    Review of Systems  Constitutional: Subjective fever and body aches w/ fatigue.  Eyes: No visual changes. ENT: Positive sore throat. Cardiovascular: Positive chest pain. Respiratory: Denies shortness of breath. Positive cough.  Gastrointestinal: No abdominal pain.  No nausea, no vomiting.  No diarrhea.  No constipation. Genitourinary: Negative for dysuria. Musculoskeletal: Negative for back pain. Skin: Negative for rash. Neurological: Negative for focal weakness or numbness. Positive HA.   10-point ROS otherwise negative.  ____________________________________________   PHYSICAL EXAM:  VITAL SIGNS: ED Triage Vitals   Enc Vitals Group     BP 06/08/20 2249 (!) 169/121     Pulse Rate 06/08/20 2249 99     Resp 06/08/20 2249 16     Temp 06/08/20 2249 99 F (37.2 C)     Temp Source 06/08/20 2249 Oral     SpO2 06/08/20 2249 97 %     Weight 06/08/20 2341 220 lb (99.8 kg)     Height 06/08/20 2341 6\' 4"  (1.93 m)   Constitutional: Alert and oriented. Well appearing and in no acute distress. Eyes: Conjunctivae are slightly injected bilaterally. No exudate.  Head: Atraumatic. Nose: Positive congestion/rhinnorhea. Mouth/Throat: Mucous membranes are moist.   Neck: No stridor.   Cardiovascular: Normal rate, regular rhythm. Good peripheral circulation. Grossly normal heart sounds.   Respiratory: Normal respiratory effort.  No retractions. Lungs CTAB. Gastrointestinal: Soft and nontender. No distention.  Musculoskeletal: No gross deformities of extremities. Neurologic:  Normal speech and language.  Skin:  Skin is warm, dry and intact. No rash noted.  ____________________________________________   LABS (all labs ordered are listed, but only abnormal results are displayed)  Labs Reviewed  BASIC METABOLIC PANEL - Abnormal; Notable for the following components:      Result Value   Glucose, Bld 106 (*)    All other components within normal limits  SARS CORONAVIRUS 2 (TAT 6-24 HRS)  CBC WITH DIFFERENTIAL/PLATELET  POC SARS CORONAVIRUS 2 AG -  ED  TROPONIN I (HIGH SENSITIVITY)  TROPONIN I (HIGH SENSITIVITY)   ____________________________________________  EKG   EKG Interpretation  Date/Time:  Wednesday June 08 2020 22:43:47 EST Ventricular Rate:  97 PR Interval:  144 QRS Duration: 84 QT Interval:  346 QTC Calculation: 439 R Axis:   88 Text Interpretation: Normal sinus rhythm Normal ECG No STEMI Confirmed by Alona Bene [phone]) on 06/09/2020 10:06:08 AM       ____________________________________________  RADIOLOGY  DG Chest 2 View  Result Date: 06/09/2020 CLINICAL DATA:  Cough and chest  pain x2 weeks. EXAM: CHEST - 2 VIEW COMPARISON:  None. FINDINGS: The heart size and mediastinal contours are within normal limits. Both lungs are clear. The visualized skeletal structures are unremarkable. IMPRESSION: No active cardiopulmonary disease. Electronically Signed   By: Aram Candela M.D.   On: 06/09/2020 00:10    ____________________________________________   PROCEDURES  Procedure(s) performed:   Procedures  None  ____________________________________________   INITIAL IMPRESSION / ASSESSMENT AND PLAN / ED COURSE  Pertinent labs & imaging results that were available during my care of the patient were reviewed by me and considered in my medical decision making (see chart for details).   Patient presents to the emergency department with cough, congestion, sore throat.  He is having some associated central chest pressure which does not seem associated with his coughing.  Suspect URI primarily, possibly Covid.  Will send antigen testing initially and if negative plan for PCR.  Patient is unvaccinated.  He is not hypoxic.  Doubt PE.  ACS lower on my differential but with his constant chest pressure do feel he warrants labs including troponin. PERC negative.   Labs reviewed. No acute findings. COVID PCR sent and patient to remain in quarantine pending test results per CDC guidelines.  Discussed PCP follow-up plan to reassess symptoms and provide additional referrals and/or treatment options as needed. ____________________________________________  FINAL CLINICAL IMPRESSION(S) / ED DIAGNOSES  Final diagnoses:  Precordial chest pain  Upper respiratory tract infection, unspecified type    NEW OUTPATIENT MEDICATIONS STARTED DURING THIS VISIT:  Discharge Medication List as of 06/09/2020 12:19 PM    START taking these medications   Details  albuterol (VENTOLIN HFA) 108 (90 Base) MCG/ACT inhaler Inhale 1-2 puffs into the lungs every 6 (six) hours as needed for wheezing or  shortness of breath., Starting Thu 06/09/2020, Normal        Note:  This document was prepared using Dragon voice recognition software and may include unintentional dictation errors.  Alona Bene, MD, [facility]    Baneen Wieseler, Arlyss Repress, MD 06/10/20 [id number]

## 2020-09-08 ENCOUNTER — Encounter (HOSPITAL_COMMUNITY): Payer: Self-pay | Admitting: Emergency Medicine

## 2020-09-08 ENCOUNTER — Ambulatory Visit (HOSPITAL_COMMUNITY)
Admission: EM | Admit: 2020-09-08 | Discharge: 2020-09-08 | Disposition: A | Discharge status: Home / Self Care | Payer: 59 | Attending: Internal Medicine | Admitting: Internal Medicine

## 2020-09-08 ENCOUNTER — Other Ambulatory Visit: Payer: Self-pay

## 2020-09-08 DIAGNOSIS — Z79899 Other long term (current) drug therapy: Secondary | ICD-10-CM | POA: Insufficient documentation

## 2020-09-08 DIAGNOSIS — R059 Cough, unspecified: Secondary | ICD-10-CM | POA: Diagnosis present

## 2020-09-08 DIAGNOSIS — Z87891 Personal history of nicotine dependence: Secondary | ICD-10-CM | POA: Diagnosis not present

## 2020-09-08 DIAGNOSIS — R439 Unspecified disturbances of smell and taste: Secondary | ICD-10-CM | POA: Insufficient documentation

## 2020-09-08 DIAGNOSIS — R430 Anosmia: Secondary | ICD-10-CM | POA: Diagnosis not present

## 2020-09-08 DIAGNOSIS — J309 Allergic rhinitis, unspecified: Secondary | ICD-10-CM

## 2020-09-08 DIAGNOSIS — R0981 Nasal congestion: Secondary | ICD-10-CM | POA: Diagnosis not present

## 2020-09-08 DIAGNOSIS — Z20822 Contact with and (suspected) exposure to covid-19: Secondary | ICD-10-CM | POA: Diagnosis not present

## 2020-09-08 MED ORDER — SALINE SPRAY 0.65 % NA SOLN: 1.0000 | NASAL | 0 refills | Status: DC | PRN

## 2020-09-08 MED ORDER — GUAIFENESIN ER 600 MG PO TB12: 600.0000 mg | ORAL_TABLET | Freq: Two times a day (BID) | ORAL | 0 refills | Status: AC

## 2020-09-08 MED ORDER — CETIRIZINE HCL 10 MG PO TABS: 10.0000 mg | ORAL_TABLET | Freq: Every day | ORAL | 0 refills | Status: DC

## 2020-09-08 NOTE — ED Triage Notes (Signed)
Pt presents with sinus congestion, cough, and "nasty taste" in mouth. Has been taking Flonase and Claritin daily.

## 2020-09-08 NOTE — ED Provider Notes (Signed)
[facility]    CSN: [id number] Arrival date & time: 09/08/20  1847      History   Chief Complaint Chief Complaint  Patient presents with  . Nasal Congestion  . Cough  . Loss of Taste    HPI Patrick Kennedy is a 40 y.o. male comes to the urgent care with sinus congestion, cough and a nasty taste in the mouth.  Patient was seen for acute sinusitis about a couple of months ago.  He was prescribed Flonase and Claritin as well as some Afrin.  Patient has been using Flonase consistently and has been intermittently using Afrin and Claritin.  Patient symptoms improved but has recurred.  He denies any seasonal allergies.  No fever or chills.  Nasal discharge was initially yellowish but has recently cleared up.  No facial pain.  Patient describes loss of taste and smell.  Patient denies any fever or generalized body aches.  No nausea or vomiting.  No sick contacts.  Patient is not vaccinated against COVID-19 virus.  Patient's cough is nonproductive.  Cough is mild without shortness of breath or wheezing.  Patient does not use a humidifier at home.  HPI  Past Medical History:  Diagnosis Date  . Hypertension     There are no problems to display for this patient.   Past Surgical History:  Procedure Laterality Date  . DENTAL SURGERY         Home Medications    Prior to Admission medications   Medication Sig Start Date End Date Taking? Authorizing Provider  cetirizine (ZYRTEC ALLERGY) 10 MG tablet Take 1 tablet (10 mg total) by mouth daily. 09/08/20  Yes Jairus Tonne, Britta Mccreedy, MD  guaiFENesin (MUCINEX) 600 MG 12 hr tablet Take 1 tablet (600 mg total) by mouth 2 (two) times daily for 14 days. 09/08/20 09/22/20 Yes Rechel Delosreyes, Britta Mccreedy, MD  sodium chloride (OCEAN) 0.65 % SOLN nasal spray Place 1 spray into both nostrils as needed for congestion. 09/08/20  Yes Arlind Klingerman, Britta Mccreedy, MD  albuterol (VENTOLIN HFA) 108 (90 Base) MCG/ACT inhaler Inhale 1-2 puffs into the lungs every 6 (six)  hours as needed for wheezing or shortness of breath. 06/09/20   Long, Arlyss Repress, MD  benzonatate (TESSALON) 100 MG capsule Take 1 capsule (100 mg total) by mouth 3 (three) times daily as needed for cough. 06/09/20   Long, Arlyss Repress, MD  DM-Doxylamine-Acetaminophen (NYQUIL COLD & FLU PO) Take 10 mLs by mouth at bedtime as needed.    [provider]  fluticasone (FLONASE) 50 MCG/ACT nasal spray Place 2 sprays into both nostrils daily. 07/24/17   Long, Arlyss Repress, MD  ibuprofen (ADVIL) 800 MG tablet Take 1 tablet (800 mg total) by mouth every 8 (eight) hours as needed for moderate pain. 06/09/20   Long, Arlyss Repress, MD  ondansetron (ZOFRAN ODT) 4 MG disintegrating tablet Take 1 tablet (4 mg total) by mouth every 8 (eight) hours as needed for nausea or vomiting. 09/03/17   Roxy Horseman, PA-C    Family History Family History  Problem Relation Age of Onset  . Hypertension Mother   . Hypertension Father     Social History Social History   Tobacco Use  . Smoking status: Former Smoker    Quit date: 07/29/2017    Years since quitting: 3.1  . Smokeless tobacco: Never Used  Vaping Use  . Vaping Use: Never used  Substance Use Topics  . Alcohol use: Yes    Comment: 4-5 times  a week.   . Drug use: No     Allergies   Patient has no known allergies.   Review of Systems Review of Systems  HENT: Positive for congestion. Negative for ear pain, facial swelling, sinus pressure, sinus pain, sore throat and voice change.   Respiratory: Positive for cough. Negative for shortness of breath and wheezing.   Cardiovascular: Negative for chest pain and palpitations.  Neurological: Negative.      Physical Exam Triage Vital Signs ED Triage Vitals  Enc Vitals Group     BP 09/08/20 1905 (!) 153/118     Pulse Rate 09/08/20 1905 100     Resp 09/08/20 1905 17     Temp 09/08/20 1905 98.8 F (37.1 C)     Temp Source 09/08/20 1905 Oral     SpO2 09/08/20 1905 97 %     Weight --      Height --      Head  Circumference --      Peak Flow --      Pain Score 09/08/20 1902 0     Pain Loc --      Pain Edu? --      Excl. in [place]? --    No data found.  Updated Vital Signs BP (!) 153/118 (BP Location: Right Arm)   Pulse 100   Temp 98.8 F (37.1 C) (Oral)   Resp 17   SpO2 97%   Visual Acuity Right Eye Distance:   Left Eye Distance:   Bilateral Distance:    Right Eye Near:   Left Eye Near:    Bilateral Near:     Physical Exam Vitals and nursing note reviewed.  Constitutional:      General: He is not in acute distress.    Appearance: He is not ill-appearing.  HENT:     Right Ear: Tympanic membrane normal.     Left Ear: Tympanic membrane normal.     Nose: Congestion present.     Mouth/Throat:     Pharynx: No posterior oropharyngeal erythema.  Eyes:     Extraocular Movements: Extraocular movements intact.     Pupils: Pupils are equal, round, and reactive to light.  Cardiovascular:     Rate and Rhythm: Normal rate and regular rhythm.     Pulses: Normal pulses.     Heart sounds: Normal heart sounds.  Pulmonary:     Effort: Pulmonary effort is normal.     Breath sounds: Normal breath sounds.  Musculoskeletal:     Cervical back: Normal range of motion. No rigidity or tenderness.  Lymphadenopathy:     Cervical: No cervical adenopathy.  Neurological:     Mental Status: He is alert.      UC Treatments / Results  Labs (all labs ordered are listed, but only abnormal results are displayed) Labs Reviewed  SARS CORONAVIRUS 2 (TAT 6-24 HRS)    EKG   Radiology No results found.  Procedures Procedures (including critical care time)  Medications Ordered in UC Medications - No data to display  Initial Impression / Assessment and Plan / UC Course  I have reviewed the triage vital signs and the nursing notes.  Pertinent labs & imaging results that were available during my care of the patient were reviewed by me and considered in my medical decision making (see chart for  details).     1.  Allergic sinusitis with anosmia: Saline nasal wash 2-3 times a day Discontinue Afrin use Patient is advised to start  taking Mucinex and Zyrtec. Humidifier use at bedtime is recommended. COVID-19 PCR test sent Return precautions given. Final Clinical Impressions(s) / UC Diagnoses   Final diagnoses:  Allergic sinusitis  Anosmia     Discharge Instructions     Saline nasal wash/spray use 2-3 times a day Discontinue Afrin use Hold off Flonase use for the next week Start taking Mucinex and Claritin If symptoms worsen return to urgent care. Humidifier use will help with the nasal symptoms. We will call you if your labs are abnormal.   ED Prescriptions    Medication Sig Dispense Auth. Provider   cetirizine (ZYRTEC ALLERGY) 10 MG tablet Take 1 tablet (10 mg total) by mouth daily. 30 tablet Trice Aspinall, Britta Mccreedy, MD   sodium chloride (OCEAN) 0.65 % SOLN nasal spray Place 1 spray into both nostrils as needed for congestion. 88 mL Ronni Osterberg, Britta Mccreedy, MD   guaiFENesin (MUCINEX) 600 MG 12 hr tablet Take 1 tablet (600 mg total) by mouth 2 (two) times daily for 14 days. 28 tablet Jeany Seville, Britta Mccreedy, MD     PDMP not reviewed this encounter.   Merrilee Jansky, MD 09/09/20 1017

## 2020-09-08 NOTE — Discharge Instructions (Addendum)
Saline nasal wash/spray use 2-3 times a day Discontinue Afrin use Hold off Flonase use for the next week Start taking Mucinex and Claritin If symptoms worsen return to urgent care. Humidifier use will help with the nasal symptoms. We will call you if your labs are abnormal.

## 2020-09-09 LAB — SARS CORONAVIRUS 2 (TAT 6-24 HRS): SARS Coronavirus 2: NEGATIVE

## 2020-11-05 ENCOUNTER — Ambulatory Visit (HOSPITAL_COMMUNITY)
Admission: EM | Admit: 2020-11-05 | Discharge: 2020-11-05 | Disposition: A | Discharge status: Home / Self Care | Payer: 59 | Attending: Internal Medicine | Admitting: Internal Medicine

## 2020-11-05 DIAGNOSIS — Z1152 Encounter for screening for COVID-19: Secondary | ICD-10-CM | POA: Diagnosis not present

## 2020-11-05 NOTE — ED Triage Notes (Signed)
Pt presents today requesting to be tested for Covid. He reports both parents are positive. He denies symptoms.

## 2020-11-06 LAB — SARS CORONAVIRUS 2 (TAT 6-24 HRS): SARS Coronavirus 2: NEGATIVE

## 2021-03-15 IMAGING — CR DG CHEST 2V
2 series · 2 of 2 positions shown · non-contrast
Comparison: None.

CLINICAL DATA: Cough and chest pain x2 weeks.

EXAM:
CHEST - 2 VIEW

[chest pa]
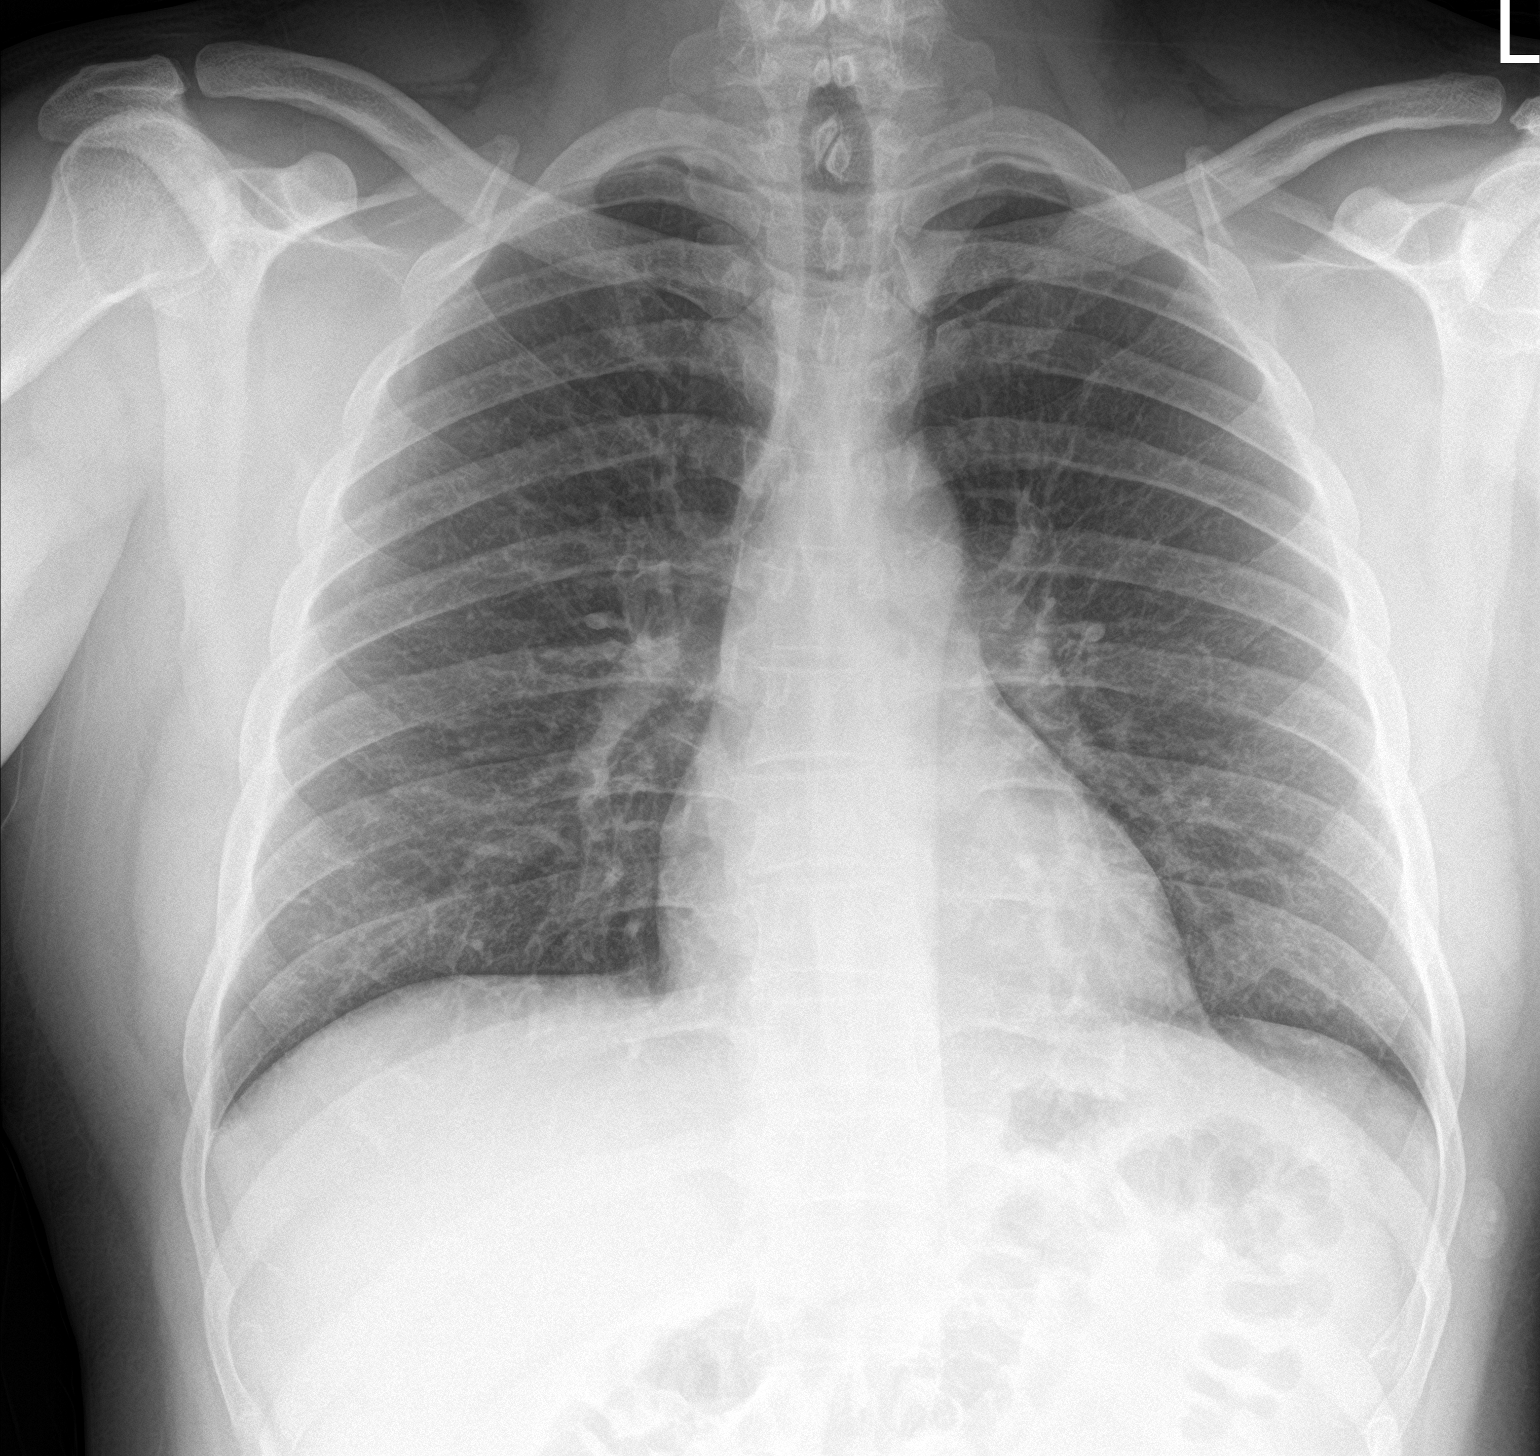

[chest lat]
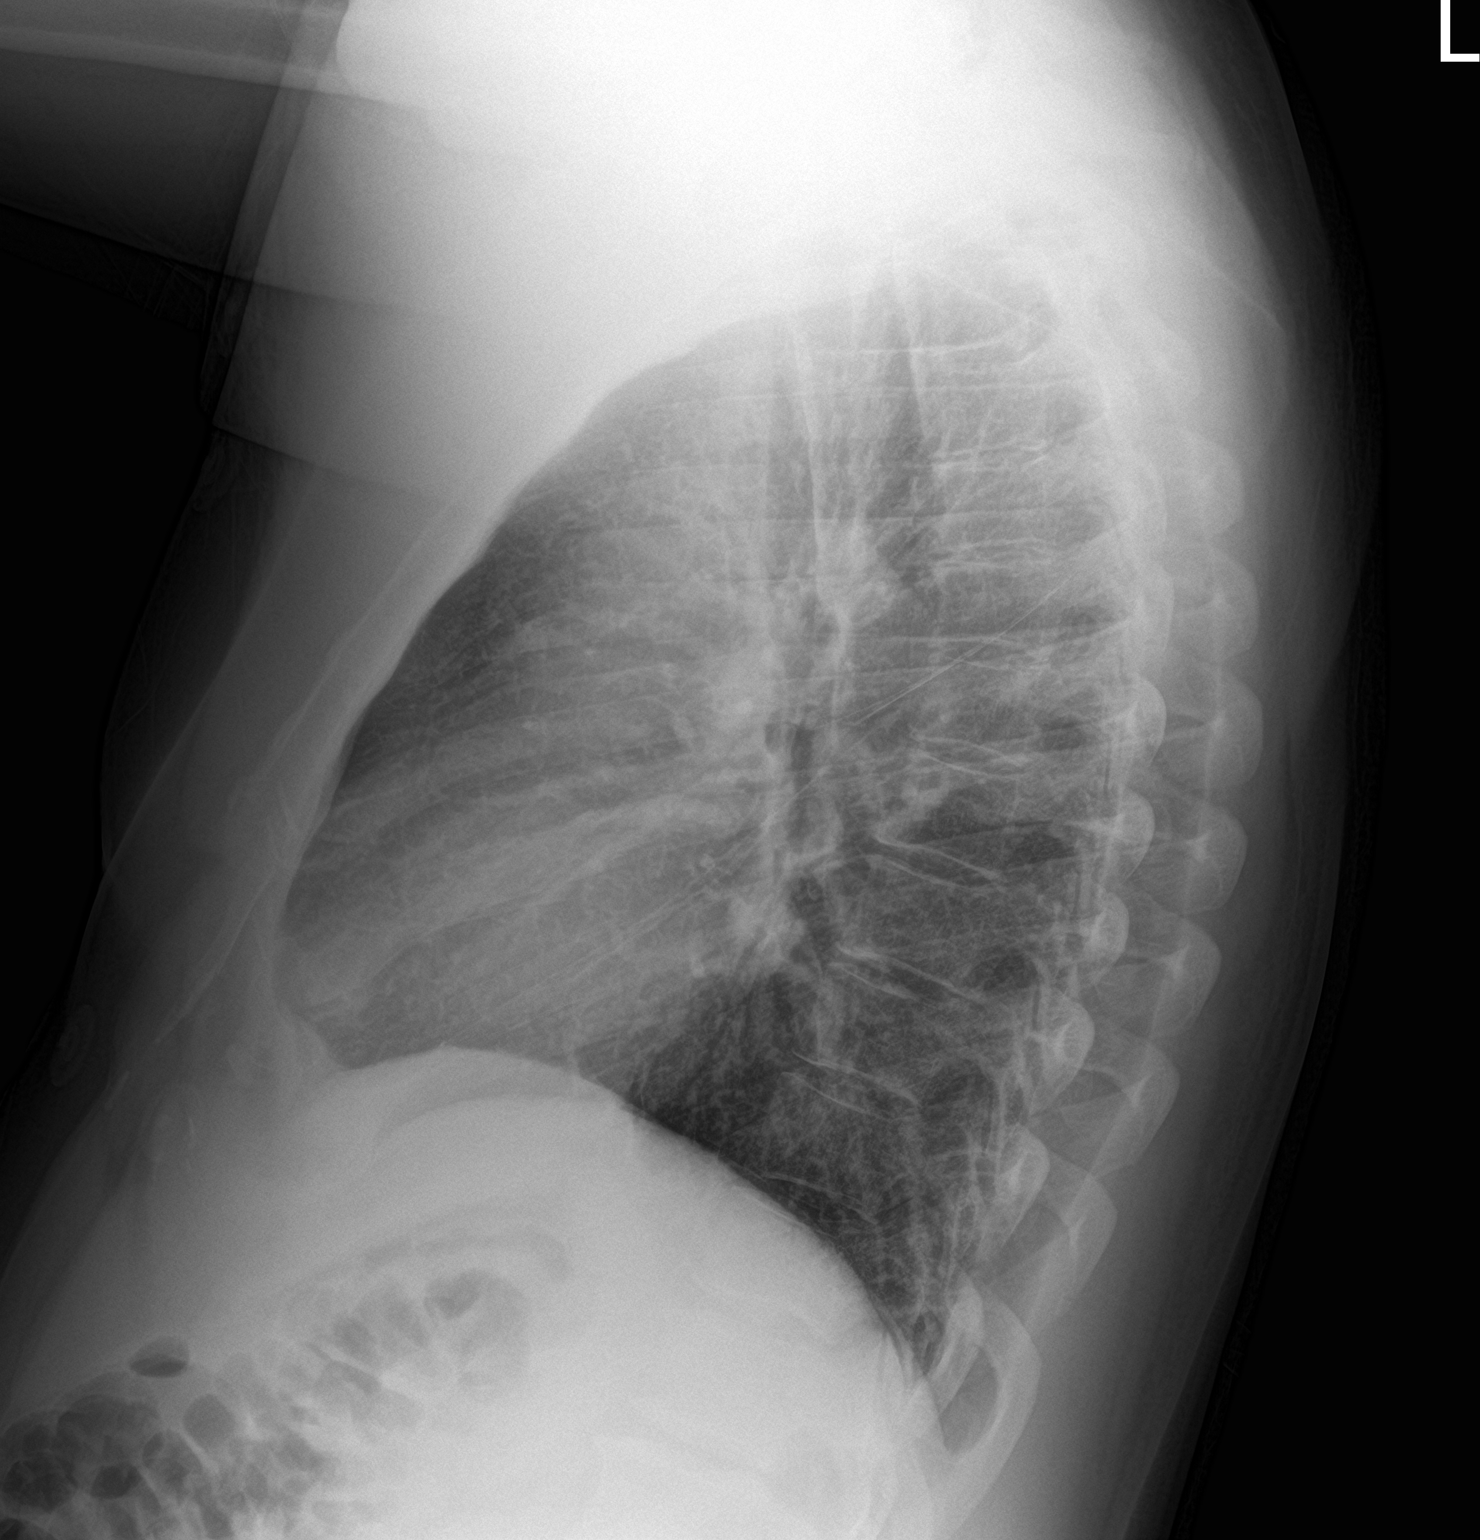

[2 of 2 positions shown; findings below may reference images not displayed]

FINDINGS: The heart size and mediastinal contours are within normal limits.
Both lungs are clear. The visualized skeletal structures are
unremarkable.
IMPRESSION: No active cardiopulmonary disease.

## 2022-03-02 ENCOUNTER — Encounter (HOSPITAL_COMMUNITY): Payer: Self-pay | Admitting: Emergency Medicine

## 2022-03-02 ENCOUNTER — Ambulatory Visit (HOSPITAL_COMMUNITY)
Admission: EM | Admit: 2022-03-02 | Discharge: 2022-03-02 | Disposition: A | Discharge status: Home / Self Care | Payer: Self-pay | Attending: Family Medicine | Admitting: Family Medicine

## 2022-03-02 DIAGNOSIS — N451 Epididymitis: Secondary | ICD-10-CM | POA: Insufficient documentation

## 2022-03-02 LAB — POCT URINALYSIS DIPSTICK, ED / UC
Bilirubin Urine: NEGATIVE
Glucose, UA: NEGATIVE mg/dL
Ketones, ur: NEGATIVE mg/dL
Leukocytes,Ua: NEGATIVE
Nitrite: NEGATIVE
Protein, ur: NEGATIVE mg/dL
Specific Gravity, Urine: >=1.03 (ref 1.005–1.030)
Urobilinogen, UA: 0.2 mg/dL (ref 0.0–1.0)
pH: 5.5 (ref 5.0–8.0)

## 2022-03-02 MED ORDER — DOXYCYCLINE HYCLATE 100 MG PO CAPS: 100.0000 mg | ORAL_CAPSULE | Freq: Two times a day (BID) | ORAL | 0 refills | Status: AC

## 2022-03-02 MED ORDER — IBUPROFEN 800 MG PO TABS: 800.0000 mg | ORAL_TABLET | Freq: Three times a day (TID) | ORAL | 0 refills | Status: DC | PRN

## 2022-03-02 NOTE — ED Triage Notes (Signed)
Pt reports testicle pain x 1 week. States pain worsens with sexual intercourse. Reports pain is more on the left side but the right testicle has intermittent pain as well. Denies dysuria and penile discharge. Noticed urinary frequency and bilateral flank pain the past 2/3 days.

## 2022-03-02 NOTE — ED Provider Notes (Signed)
[facility]    CSN: [id number] Arrival date & time: 03/02/22  1849      History   Chief Complaint Chief Complaint  Patient presents with   Testicle Pain    HPI Patrick Kennedy is a 41 y.o. male.    Testicle Pain   Here for 5 to 7-day history of some soreness in his scrotum.  It is bilateral.  He does not note any swelling or feeling of hardness or mass.  No ulcerations.  No dysuria or itching or urinary frequency.  No abdominal pain or vomiting.  No fever.   The pain was a lot worse last night after he had had intercourse.  It is now a lower grade pain tonight.  Past Medical History:  Diagnosis Date   Hypertension     There are no problems to display for this patient.   Past Surgical History:  Procedure Laterality Date   DENTAL SURGERY         Home Medications    Prior to Admission medications   Medication Sig Start Date End Date Taking? Authorizing Provider  doxycycline (VIBRAMYCIN) 100 MG capsule Take 1 capsule (100 mg total) by mouth 2 (two) times daily for 7 days. 03/02/22 03/09/22 Yes Zenia Resides, MD  ibuprofen (ADVIL) 800 MG tablet Take 1 tablet (800 mg total) by mouth every 8 (eight) hours as needed (pain). 03/02/22  Yes Zenia Resides, MD  albuterol (VENTOLIN HFA) 108 (90 Base) MCG/ACT inhaler Inhale 1-2 puffs into the lungs every 6 (six) hours as needed for wheezing or shortness of breath. 06/09/20   Long, Arlyss Repress, MD  benzonatate (TESSALON) 100 MG capsule Take 1 capsule (100 mg total) by mouth 3 (three) times daily as needed for cough. 06/09/20   Long, Arlyss Repress, MD  cetirizine (ZYRTEC ALLERGY) 10 MG tablet Take 1 tablet (10 mg total) by mouth daily. 09/08/20   Merrilee Jansky, MD  DM-Doxylamine-Acetaminophen (NYQUIL COLD & FLU PO) Take 10 mLs by mouth at bedtime as needed.    [provider]  fluticasone (FLONASE) 50 MCG/ACT nasal spray Place 2 sprays into both nostrils daily. 07/24/17   Long, Arlyss Repress, MD  ondansetron  (ZOFRAN ODT) 4 MG disintegrating tablet Take 1 tablet (4 mg total) by mouth every 8 (eight) hours as needed for nausea or vomiting. 09/03/17   Roxy Horseman, PA-C  sodium chloride (OCEAN) 0.65 % SOLN nasal spray Place 1 spray into both nostrils as needed for congestion. 09/08/20   LampteyBritta Mccreedy, MD    Family History Family History  Problem Relation Age of Onset   Hypertension Mother    Hypertension Father     Social History Social History   Tobacco Use   Smoking status: Former    Types: Cigarettes    Quit date: 07/29/2017    Years since quitting: 4.5   Smokeless tobacco: Never  Vaping Use   Vaping Use: Never used  Substance Use Topics   Alcohol use: Yes    Comment: 4-5 times a week.    Drug use: No     Allergies   Patient has no known allergies.   Review of Systems Review of Systems  Genitourinary:  Positive for testicular pain.     Physical Exam Triage Vital Signs ED Triage Vitals [03/02/22 1912]  Enc Vitals Group     BP (!) 156/94     Pulse Rate 85     Resp 18     Temp  98.7 F (37.1 C)     Temp Source Oral     SpO2 96 %     Weight      Height      Head Circumference      Peak Flow      Pain Score 0     Pain Loc      Pain Edu?      Excl. in [place]?    No data found.  Updated Vital Signs BP (!) 156/94 (BP Location: Left Arm)   Pulse 85   Temp 98.7 F (37.1 C) (Oral)   Resp 18   SpO2 96%   Visual Acuity Right Eye Distance:   Left Eye Distance:   Bilateral Distance:    Right Eye Near:   Left Eye Near:    Bilateral Near:     Physical Exam Vitals reviewed.  Constitutional:      General: He is not in acute distress.    Appearance: He is not ill-appearing, toxic-appearing or diaphoretic.  Cardiovascular:     Rate and Rhythm: Regular rhythm.     Heart sounds: No murmur heard. Pulmonary:     Effort: Pulmonary effort is normal.     Breath sounds: Normal breath sounds.  Abdominal:     Palpations: Abdomen is soft.     Tenderness: There is  no abdominal tenderness.  Skin:    Coloration: Skin is not jaundiced or pale.  Neurological:     Mental Status: He is oriented to person, place, and time.  Psychiatric:        Behavior: Behavior normal.      UC Treatments / Results  Labs (all labs ordered are listed, but only abnormal results are displayed) Labs Reviewed  POCT URINALYSIS DIPSTICK, ED / UC - Abnormal; Notable for the following components:      Result Value   Hgb urine dipstick TRACE (*)    All other components within normal limits  CYTOLOGY, (ORAL, ANAL, URETHRAL) ANCILLARY ONLY    EKG   Radiology No results found.  Procedures Procedures (including critical care time)  Medications Ordered in UC Medications - No data to display  Initial Impression / Assessment and Plan / UC Course  I have reviewed the triage vital signs and the nursing notes.  Pertinent labs & imaging results that were available during my care of the patient were reviewed by me and considered in my medical decision making (see chart for details).          UA is negative except for a trace of hemoglobin.  I am going to treat for possible epididymitis with doxycycline.  Staff will notify him of any positives on the swab Final Clinical Impressions(s) / UC Diagnoses   Final diagnoses:  Epididymitis     Discharge Instructions      The urinalysis did not show any significant abnormality  Staff will notify you of any positives on the swab  Take doxycycline 100 mg --1 capsule 2 times daily for 7 days   Take ibuprofen 800 mg--1 tab every 8 hours as needed for pain.      ED Prescriptions     Medication Sig Dispense Auth. Provider   doxycycline (VIBRAMYCIN) 100 MG capsule Take 1 capsule (100 mg total) by mouth 2 (two) times daily for 7 days. 14 capsule Barrett Henle, MD   ibuprofen (ADVIL) 800 MG tablet Take 1 tablet (800 mg total) by mouth every 8 (eight) hours as needed (pain). 21 tablet Durant Scibilia,  Janace Aris, MD       PDMP not reviewed this encounter.   Zenia Resides, MD 03/02/22 2005

## 2022-03-02 NOTE — Discharge Instructions (Addendum)
The urinalysis did not show any significant abnormality  Staff will notify you of any positives on the swab  Take doxycycline 100 mg --1 capsule 2 times daily for 7 days   Take ibuprofen 800 mg--1 tab every 8 hours as needed for pain.

## 2022-03-05 ENCOUNTER — Telehealth (HOSPITAL_COMMUNITY): Payer: Self-pay | Admitting: Emergency Medicine

## 2022-03-05 LAB — CYTOLOGY, (ORAL, ANAL, URETHRAL) ANCILLARY ONLY
Chlamydia: NEGATIVE
Comment: NEGATIVE
Comment: NEGATIVE
Comment: NORMAL
Neisseria Gonorrhea: NEGATIVE
Trichomonas: POSITIVE — AB

## 2022-03-05 MED ORDER — METRONIDAZOLE 500 MG PO TABS: 2000.0000 mg | ORAL_TABLET | Freq: Once | ORAL | 0 refills | Status: AC

## 2022-12-27 ENCOUNTER — Encounter (HOSPITAL_COMMUNITY): Payer: Self-pay | Admitting: Emergency Medicine

## 2022-12-27 ENCOUNTER — Ambulatory Visit (HOSPITAL_COMMUNITY)
Admission: EM | Admit: 2022-12-27 | Discharge: 2022-12-27 | Disposition: A | Discharge status: Home / Self Care | Payer: Medicaid Other | Attending: Family Medicine | Admitting: Family Medicine

## 2022-12-27 DIAGNOSIS — R109 Unspecified abdominal pain: Secondary | ICD-10-CM | POA: Insufficient documentation

## 2022-12-27 DIAGNOSIS — R300 Dysuria: Secondary | ICD-10-CM | POA: Insufficient documentation

## 2022-12-27 LAB — POCT URINALYSIS DIP (MANUAL ENTRY)
Bilirubin, UA: NEGATIVE
Glucose, UA: NEGATIVE mg/dL
Ketones, POC UA: NEGATIVE mg/dL
Nitrite, UA: NEGATIVE
Protein Ur, POC: NEGATIVE mg/dL
Spec Grav, UA: 1.025 (ref 1.010–1.025)
Urobilinogen, UA: 1 E.U./dL
pH, UA: 5.5 (ref 5.0–8.0)

## 2022-12-27 MED ORDER — CIPROFLOXACIN HCL 500 MG PO TABS: 500.0000 mg | ORAL_TABLET | Freq: Two times a day (BID) | ORAL | 0 refills | Status: AC

## 2022-12-27 NOTE — Discharge Instructions (Addendum)
The urinalysis only had a tiny amount of white blood cells and red blood cells.  This may be normal or could be a sign of infection.  Urine culture is sent and staff will notify you if it looks like you need to change or stop that antibiotic.  Take Cipro 500 mg--1 tablet 2 times daily for 7 days  Make sure you are drinking enough fluids.  If you are worsening anyway, such as having fever, vomiting, having more intense pain, or feeling weak, please go to the emergency room  You can use the QR code/website at the back of the summary paperwork to schedule yourself a new patient appointment with primary care

## 2022-12-27 NOTE — ED Provider Notes (Addendum)
[facility]    CSN: [id number] Arrival date & time: 12/27/22  4098      History   Chief Complaint Chief Complaint  Patient presents with   Flank Pain   Dysuria    HPI Patrick Kennedy is a 42 y.o. male.    Flank Pain  Dysuria Presenting symptoms: dysuria   Associated symptoms: flank pain   Here for right-sided flank pain.  It began 1 week ago  He is also now having some right upper quadrant and left upper quadrant pains.  Pain has not been radiating into his groin.  No fever or chills or upper respiratory symptoms.  No nausea or vomiting.  He has had some days where he does not have a bowel movement and then he might have 2 or 3 loose stools.  Any blood in his urine he is having occasional dysuria at that initiation of his urinary stream.  No penile discharge or itching.   Past Medical History:  Diagnosis Date   Hypertension     There are no problems to display for this patient.   Past Surgical History:  Procedure Laterality Date   DENTAL SURGERY         Home Medications    Prior to Admission medications   Medication Sig Start Date End Date Taking? Authorizing Provider  ciprofloxacin (CIPRO) 500 MG tablet Take 1 tablet (500 mg total) by mouth 2 (two) times daily for 7 days. 12/27/22 01/03/23 Yes Zenia Resides, MD    Family History Family History  Problem Relation Age of Onset   Hypertension Mother    Hypertension Father     Social History Social History   Tobacco Use   Smoking status: Former    Current packs/day: 0.00    Types: Cigarettes    Quit date: 07/29/2017    Years since quitting: 5.4   Smokeless tobacco: Never  Vaping Use   Vaping status: Never Used  Substance Use Topics   Alcohol use: Yes    Comment: 4-5 times a week.    Drug use: No     Allergies   Patient has no known allergies.   Review of Systems Review of Systems  Genitourinary:  Positive for dysuria and flank pain.     Physical Exam Triage  Vital Signs ED Triage Vitals  Encounter Vitals Group     BP 12/27/22 1044 (!) 176/114     Systolic BP Percentile --      Diastolic BP Percentile --      Pulse Rate 12/27/22 1044 80     Resp 12/27/22 1044 16     Temp 12/27/22 1044 99 F (37.2 C)     Temp Source 12/27/22 1044 Oral     SpO2 12/27/22 1044 97 %     Weight --      Height --      Head Circumference --      Peak Flow --      Pain Score 12/27/22 1045 8     Pain Loc --      Pain Education --      Exclude from Growth Chart --    No data found.  Updated Vital Signs BP (!) 176/114 (BP Location: Left Arm)   Pulse 80   Temp 99 F (37.2 C) (Oral)   Resp 16   SpO2 97%   Visual Acuity Right Eye Distance:   Left Eye Distance:   Bilateral Distance:    Right Eye  Near:   Left Eye Near:    Bilateral Near:     Physical Exam Vitals reviewed.  Constitutional:      General: He is not in acute distress.    Appearance: He is not ill-appearing, toxic-appearing or diaphoretic.  HENT:     Mouth/Throat:     Mouth: Mucous membranes are moist.  Eyes:     Extraocular Movements: Extraocular movements intact.     Conjunctiva/sclera: Conjunctivae normal.     Pupils: Pupils are equal, round, and reactive to light.  Cardiovascular:     Rate and Rhythm: Normal rate and regular rhythm.     Heart sounds: No murmur heard. Pulmonary:     Effort: Pulmonary effort is normal.     Breath sounds: Normal breath sounds.  Abdominal:     Palpations: Abdomen is soft.     Comments: There is no right CVA tenderness.  There is no rash on the right flank.  There is some mild tenderness of his right upper quadrant which causes some discomfort in his left upper quadrant.  No mass no guarding or distention  Musculoskeletal:     Cervical back: Neck supple.  Lymphadenopathy:     Cervical: No cervical adenopathy.  Skin:    Capillary Refill: Capillary refill takes less than 2 seconds.     Coloration: Skin is not jaundiced or pale.  Neurological:      General: No focal deficit present.     Mental Status: He is oriented to person, place, and time.  Psychiatric:        Behavior: Behavior normal.      UC Treatments / Results  Labs (all labs ordered are listed, but only abnormal results are displayed) Labs Reviewed  POCT URINALYSIS DIP (MANUAL ENTRY) - Abnormal; Notable for the following components:      Result Value   Color, UA straw (*)    Clarity, UA cloudy (*)    Blood, UA trace-intact (*)    Leukocytes, UA Trace (*)    All other components within normal limits  URINE CULTURE  CYTOLOGY, (ORAL, ANAL, URETHRAL) ANCILLARY ONLY    EKG   Radiology No results found.  Procedures Procedures (including critical care time)  Medications Ordered in UC Medications - No data to display  Initial Impression / Assessment and Plan / UC Course  I have reviewed the triage vital signs and the nursing notes.  Pertinent labs & imaging results that were available during my care of the patient were reviewed by me and considered in my medical decision making (see chart for details).        Urinalysis has a trace of blood and a trace of leukocytes.  Urine culture is sent. Urethral self swab is done, and we will notify him and treat per protocol any positives.  Cipro was sent to treat possible UTI, though that would be unusual in his age group.  If he has not improved at all taking the Cipro, and the urine culture is negative, I would have him stop the Cipro. He is given contact information for urology, and staff will help him make a primary care appointment for follow-up  He is to present to the emergency room if he has worsening way   Final Clinical Impressions(s) / UC Diagnoses   Final diagnoses:  Flank pain  Dysuria     Discharge Instructions      The urinalysis only had a tiny amount of white blood cells and red blood cells.  This may be normal or could be a sign of infection.  Urine culture is sent and staff will  notify you if it looks like you need to change or stop that antibiotic.  Take Cipro 500 mg--1 tablet 2 times daily for 7 days  Make sure you are drinking enough fluids.  If you are worsening anyway, such as having fever, vomiting, having more intense pain, or feeling weak, please go to the emergency room  You can use the QR code/website at the back of the summary paperwork to schedule yourself a new patient appointment with primary care       ED Prescriptions     Medication Sig Dispense Auth. Provider   ciprofloxacin (CIPRO) 500 MG tablet Take 1 tablet (500 mg total) by mouth 2 (two) times daily for 7 days. 14 tablet Trudie Cervantes, Janace Aris, MD      PDMP not reviewed this encounter.   Zenia Resides, MD 12/27/22 1107    Zenia Resides, MD 12/27/22 [phone]

## 2022-12-27 NOTE — ED Triage Notes (Signed)
Pt c/o bilateral flank pain and dysuria for 2 weeks. When researched thought might be a kidney stone, but denies blood and hasn't seen anything in urine. Denies taking medications, ice, heat for pain relief.

## 2022-12-31 ENCOUNTER — Telehealth: Payer: Self-pay | Admitting: Emergency Medicine

## 2022-12-31 MED ORDER — DOXYCYCLINE HYCLATE 100 MG PO CAPS: 100.0000 mg | ORAL_CAPSULE | Freq: Two times a day (BID) | ORAL | 0 refills | Status: AC

## 2023-02-24 ENCOUNTER — Encounter (HOSPITAL_COMMUNITY): Payer: Self-pay | Admitting: Emergency Medicine

## 2023-02-24 ENCOUNTER — Ambulatory Visit (HOSPITAL_COMMUNITY)
Admission: EM | Admit: 2023-02-24 | Discharge: 2023-02-24 | Disposition: A | Discharge status: Home / Self Care | Payer: Medicaid Other | Attending: Family Medicine | Admitting: Family Medicine

## 2023-02-24 DIAGNOSIS — R369 Urethral discharge, unspecified: Secondary | ICD-10-CM | POA: Insufficient documentation

## 2023-02-24 DIAGNOSIS — R399 Unspecified symptoms and signs involving the genitourinary system: Secondary | ICD-10-CM | POA: Diagnosis not present

## 2023-02-24 DIAGNOSIS — R300 Dysuria: Secondary | ICD-10-CM | POA: Insufficient documentation

## 2023-02-24 LAB — POCT URINALYSIS DIP (MANUAL ENTRY)
Bilirubin, UA: NEGATIVE
Glucose, UA: NEGATIVE mg/dL
Ketones, POC UA: NEGATIVE mg/dL
Leukocytes, UA: NEGATIVE
Nitrite, UA: NEGATIVE
Protein Ur, POC: NEGATIVE mg/dL
Spec Grav, UA: 1.025 (ref 1.010–1.025)
Urobilinogen, UA: 0.2 E.U./dL
pH, UA: 5 (ref 5.0–8.0)

## 2023-02-24 NOTE — Discharge Instructions (Addendum)
It was nice meeting you Mr. Patrick Kennedy. I will contact you soon with all your test results. Note that your urinalysis is negative for UTI.  Your BP is elevated as discussed and weight loss and exercise might help. However, I feel you might need to get on medication at some point. Please establish primary care to monitor and treat your BP.

## 2023-02-24 NOTE — ED Triage Notes (Signed)
Patient reports taking doxycycline, the last one today.  When asked who prescribed, he said he did.   Has burning at the end of urination.  Reports having clear, watery discharge.    Last std testing was 2-3 months ago.    Has been consulting the internet for answers

## 2023-02-24 NOTE — ED Provider Notes (Signed)
[facility]    CSN: [id number] Arrival date & time: 02/24/23  1311      History   Chief Complaint Chief Complaint  Patient presents with   SEXUALLY TRANSMITTED DISEASE    HPI Patrick Kennedy is a 42 y.o. male.   The history is provided by the patient. No language interpreter was used.  Dysuria Presenting symptoms: dysuria and penile discharge   Presenting symptoms comment:  Started a few days ago Context: after urination   Relieved by:  Nothing Worsened by:  Nothing Ineffective treatments: Doxycycline. Associated symptoms: urinary frequency   Associated symptoms: no abdominal pain, no fever, no genital itching, no genital lesions, no genital rash, no hematuria, no urinary hesitation, no urinary retention and no vomiting   Penile Discharge This is a new problem. The current episode started more than 1 week ago. The problem has not changed since onset.Pertinent negatives include no abdominal pain. Associated symptoms comments: He had a similar presentation for which he was treated for STD. He is sexually active with last encounter 2 days ago. Had not used condoms regularly. Nothing aggravates the symptoms. Nothing relieves the symptoms. Treatments tried: Tried Doxycycline at home.   Elevated BP: Has had this for a while, but not on meds. He denies any headaches, no cardiopulm symptoms. He said all he needed is weight loss.  Feels well otherwise.  Past Medical History:  Diagnosis Date   Hypertension     There are no problems to display for this patient.   Past Surgical History:  Procedure Laterality Date   DENTAL SURGERY         Home Medications    Prior to Admission medications   Medication Sig Start Date End Date Taking? Authorizing Provider  doxycycline (VIBRAMYCIN) 100 MG capsule Take 100 mg by mouth every 12 (twelve) hours. 02/18/23  Yes [provider]    Family History Family History  Problem Relation Age of Onset    Hypertension Mother    Hypertension Father     Social History Social History   Tobacco Use   Smoking status: Former    Current packs/day: 0.00    Types: Cigarettes    Quit date: 07/29/2017    Years since quitting: 5.5   Smokeless tobacco: Never  Vaping Use   Vaping status: Never Used  Substance Use Topics   Alcohol use: Yes    Comment: 4-5 times a week.    Drug use: No     Allergies   Patient has no known allergies.   Review of Systems Review of Systems  Constitutional:  Negative for fever.  Gastrointestinal:  Negative for abdominal pain and vomiting.  Genitourinary:  Positive for dysuria, frequency and penile discharge. Negative for hematuria and hesitancy.  All other systems reviewed and are negative.    Physical Exam Triage Vital Signs ED Triage Vitals  Encounter Vitals Group     BP      Systolic BP Percentile      Diastolic BP Percentile      Pulse      Resp      Temp      Temp src      SpO2      Weight      Height      Head Circumference      Peak Flow      Pain Score      Pain Loc      Pain Education  Exclude from Growth Chart    No data found.  Updated Vital Signs BP (!) 151/101   Pulse 91   Temp 98.1 F (36.7 C) (Oral)   Resp 20   SpO2 97%   Visual Acuity Right Eye Distance:   Left Eye Distance:   Bilateral Distance:    Right Eye Near:   Left Eye Near:    Bilateral Near:     Physical Exam Vitals and nursing note reviewed.  Cardiovascular:     Rate and Rhythm: Normal rate and regular rhythm.     Heart sounds: Normal heart sounds. No murmur heard. Pulmonary:     Effort: Pulmonary effort is normal. No respiratory distress.     Breath sounds: Normal breath sounds. No wheezing.  Abdominal:     General: Abdomen is flat. Bowel sounds are normal.  Genitourinary:    Comments: Deferred     UC Treatments / Results  Labs (all labs ordered are listed, but only abnormal results are displayed) Labs Reviewed  POCT URINALYSIS  DIP (MANUAL ENTRY) - Abnormal; Notable for the following components:      Result Value   Clarity, UA cloudy (*)    Blood, UA trace-intact (*)    All other components within normal limits  HIV ANTIBODY (ROUTINE TESTING W REFLEX)  RPR  CYTOLOGY, (ORAL, ANAL, URETHRAL) ANCILLARY ONLY    EKG   Radiology No results found.  Procedures Procedures (including critical care time)  Medications Ordered in UC Medications - No data to display  Initial Impression / Assessment and Plan / UC Course  I have reviewed the triage vital signs and the nursing notes.  Pertinent labs & imaging results that were available during my care of the patient were reviewed by me and considered in my medical decision making (see chart for details).  Clinical Course as of 02/24/23 1525  Sun Feb 24, 2023  1459 HTN Repeat BP of 151/101 Patient will work on Lifestyle modification and establish PCP care to manage his BP  [KE]  1503 UTI symptoms UA negative for UTI UTI symptoms likely cystitis Urine culture discontinued Will contact him with his STD screening results [KE]  1503 Penile discharge Suspected STD Urethral swab send for STD testing HIV and RPR tested as well I will contact him soon with his test results [KE]    Clinical Course User Index [KE] Doreene Eland, MD    Final Clinical Impressions(s) / UC Diagnoses   Final diagnoses:  Penile discharge  Dysuria  UTI symptoms     Discharge Instructions      It was nice meeting you Patrick Kennedy. I will contact you soon with all your test results. Note that your urinalysis is negative for UTI.  Your BP is elevated as discussed and weight loss and exercise might help. However, I feel you might need to get on medication at some point. Please establish primary care to monitor and treat your BP.     ED Prescriptions   None    PDMP not reviewed this encounter.   Doreene Eland, MD 02/24/23 [phone]

## 2023-02-24 NOTE — ED Notes (Signed)
Attempted x 2 to get patient's blood work. Pt should returned to the office tomorrow once he has hydrated.

## 2023-02-25 ENCOUNTER — Other Ambulatory Visit: Payer: Self-pay | Admitting: Family Medicine

## 2023-02-25 LAB — CYTOLOGY, (ORAL, ANAL, URETHRAL) ANCILLARY ONLY
Chlamydia: NEGATIVE
Comment: NEGATIVE
Comment: NEGATIVE
Comment: NORMAL
Neisseria Gonorrhea: NEGATIVE
Trichomonas: NEGATIVE

## 2023-02-25 MED ORDER — FLUCONAZOLE 150 MG PO TABS: 150.0000 mg | ORAL_TABLET | Freq: Once | ORAL | 0 refills | Status: AC

## 2023-02-25 NOTE — Progress Notes (Signed)
I called and discussed test result with him. No STDs and his UA is neg for infection. He said it seems he has yeast infection. He had similar presentation in the past and was treated for yeast. I dose of diflucan e-scribed. I advised him to return to the UC if still having any concerns. He agreed with the plan.

## 2023-04-04 ENCOUNTER — Ambulatory Visit (HOSPITAL_COMMUNITY)
Admission: EM | Admit: 2023-04-04 | Discharge: 2023-04-04 | Disposition: A | Discharge status: Home / Self Care | Payer: Medicaid Other | Attending: Family Medicine | Admitting: Family Medicine

## 2023-04-04 ENCOUNTER — Encounter (HOSPITAL_COMMUNITY): Payer: Self-pay

## 2023-04-04 DIAGNOSIS — M549 Dorsalgia, unspecified: Secondary | ICD-10-CM | POA: Diagnosis not present

## 2023-04-04 MED ORDER — KETOROLAC TROMETHAMINE 10 MG PO TABS: 10.0000 mg | ORAL_TABLET | Freq: Four times a day (QID) | ORAL | 0 refills | Status: DC | PRN

## 2023-04-04 MED ORDER — KETOROLAC TROMETHAMINE 30 MG/ML IJ SOLN
30.0000 mg | Freq: Once | INTRAMUSCULAR | Status: AC
  Administered 2023-04-04: 30 mg via INTRAMUSCULAR

## 2023-04-04 MED ORDER — TIZANIDINE HCL 4 MG PO TABS: 4.0000 mg | ORAL_TABLET | Freq: Every evening | ORAL | 0 refills | Status: DC | PRN

## 2023-04-04 MED ORDER — KETOROLAC TROMETHAMINE 30 MG/ML IJ SOLN
INTRAMUSCULAR | Status: AC
  Filled 2023-04-04: qty 1
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
## 2023-04-04 NOTE — Discharge Instructions (Signed)
You have been given a shot of Toradol 30 mg today.  Ketorolac 10 mg tablets--take 1 tablet every 6 hours as needed for pain.  This is the same medicine that is in the shot we just gave you   Take tizanidine 4 mg--1 every 8 hours as needed for muscle spasms; this medication can cause dizziness and sleepiness

## 2023-08-29 ENCOUNTER — Encounter (HOSPITAL_COMMUNITY): Payer: Self-pay

## 2023-08-29 ENCOUNTER — Ambulatory Visit (HOSPITAL_COMMUNITY)
Admission: RE | Admit: 2023-08-29 | Discharge: 2023-08-29 | Disposition: A | Discharge status: Home / Self Care | Source: Ambulatory Visit | Attending: Family Medicine | Admitting: Family Medicine

## 2023-08-29 VITALS — BP 151/107 | HR 89 | Temp 98.5°F | Resp 16

## 2023-08-29 DIAGNOSIS — Z202 Contact with and (suspected) exposure to infections with a predominantly sexual mode of transmission: Secondary | ICD-10-CM | POA: Insufficient documentation

## 2023-08-29 DIAGNOSIS — I10 Essential (primary) hypertension: Secondary | ICD-10-CM | POA: Insufficient documentation

## 2023-08-29 NOTE — Discharge Instructions (Signed)
 We have sent testing for sexually transmitted infections. We will notify you of any positive results once they are received. If required, we will prescribe any medications you might need.  Please refrain from all sexual activity for at least the next seven days.  Also, your blood pressure was noted to be elevated during your visit today. If you are currently taking medication for high blood pressure, please ensure you are taking this as directed. If you do not have a history of high blood pressure and your blood pressure remains persistently elevated, you may need to begin taking a medication at some point. You may return here within the next few days to recheck if unable to see your primary care provider or if you do not have a one.  BP (!) 151/107 (BP Location: Left Arm)   Pulse 89   Temp 98.5 F (36.9 C) (Oral)   Resp 16   SpO2 97%   BP Readings from Last 3 Encounters:  08/29/23 (!) 151/107  04/04/23 (!) 164/92  02/24/23 (!) 151/101

## 2023-08-29 NOTE — ED Provider Notes (Signed)
 [facility]   [id number] 08/29/23 Arrival Time: 1501  ASSESSMENT & PLAN:  1. Possible exposure to STD   2. Elevated blood pressure reading with diagnosis of hypertension       Discharge Instructions      We have sent testing for sexually transmitted infections. We will notify you of any positive results once they are received. If required, we will prescribe any medications you might need.  Please refrain from all sexual activity for at least the next seven days.  Also, your blood pressure was noted to be elevated during your visit today. If you are currently taking medication for high blood pressure, please ensure you are taking this as directed. If you do not have a history of high blood pressure and your blood pressure remains persistently elevated, you may need to begin taking a medication at some point. You may return here within the next few days to recheck if unable to see your primary care provider or if you do not have a one.  BP (!) 151/107 (BP Location: Left Arm)   Pulse 89   Temp 98.5 F (36.9 C) (Oral)   Resp 16   SpO2 97%   BP Readings from Last 3 Encounters:  08/29/23 (!) 151/107  04/04/23 (!) 164/92  02/24/23 (!) 151/101        Pending: Labs Reviewed  CYTOLOGY, (ORAL, ANAL, URETHRAL) ANCILLARY ONLY   No empiric treatment. Will notify of any positive results. Instructed to refrain from sexual activity until testing results available.  Reviewed expectations re: course of current medical issues. Questions answered. Outlined signs and symptoms indicating need for more acute intervention. Patient verbalized understanding. After Visit Summary given.   SUBJECTIVE:  Patrick Kennedy is a 43 y.o. male who presents with complaint of possible exp to STD. Pt repots a sexual partner was exposed to STD and he needed testing. Doesn't know what or if they were telling the truth. Denies any s/s.   Increased blood pressure noted today. Reports that  he is not currently treated; with past dx of HTN. No symptoms.  Social History   Tobacco Use  Smoking Status Former   Current packs/day: 0.00   Types: Cigarettes   Quit date: 07/29/2017   Years since quitting: 6.0  Smokeless Tobacco Never     OBJECTIVE:  Vitals:   08/29/23 1515  BP: (!) 151/107  Pulse: 89  Resp: 16  Temp: 98.5 F (36.9 C)  TempSrc: Oral  SpO2: 97%    General appearance: alert, cooperative, appears stated age and no distress Throat: lips, mucosa, and tongue normal; teeth and gums normal Back: no CVA tenderness; FROM at waist Abdomen: soft, non-tender GU: deferred Skin: warm and dry Psychological: alert and cooperative; normal mood and affect.   Labs Reviewed  CYTOLOGY, (ORAL, ANAL, URETHRAL) ANCILLARY ONLY    No Known Allergies  Past Medical History:  Diagnosis Date   Hypertension    Family History  Problem Relation Age of Onset   Hypertension Mother    Hypertension Father    Social History   Socioeconomic History   Marital status: Single    Spouse name: Not on file   Number of children: Not on file   Years of education: Not on file   Highest education level: Not on file  Occupational History   Not on file  Tobacco Use   Smoking status: Former    Current packs/day: 0.00    Types: Cigarettes    Quit date:  07/29/2017    Years since quitting: 6.0   Smokeless tobacco: Never  Vaping Use   Vaping status: Never Used  Substance and Sexual Activity   Alcohol use: Yes    Comment: 4-5 times a week.    Drug use: No   Sexual activity: Not on file  Other Topics Concern   Not on file  Social History Narrative   Not on file   Social Drivers of Health   Financial Resource Strain: Not on file  Food Insecurity: Not on file  Transportation Needs: Not on file  Physical Activity: Not on file  Stress: Not on file  Social Connections: Not on file  Intimate Partner Violence: Not on file           Mardella Layman, MD 08/29/23 1530

## 2023-08-29 NOTE — ED Triage Notes (Signed)
 Pt repots a sexual partner was exposed to STD and he needed testing. Doesn't know what or if they were telling the truth. Denies any s/s.

## 2023-08-30 ENCOUNTER — Telehealth (HOSPITAL_COMMUNITY): Payer: Self-pay

## 2023-08-30 LAB — CYTOLOGY, (ORAL, ANAL, URETHRAL) ANCILLARY ONLY
Chlamydia: NEGATIVE
Comment: NEGATIVE
Comment: NEGATIVE
Comment: NORMAL
Neisseria Gonorrhea: NEGATIVE
Trichomonas: POSITIVE — AB

## 2023-08-30 MED ORDER — METRONIDAZOLE 500 MG PO TABS: 2000.0000 mg | ORAL_TABLET | Freq: Once | ORAL | 0 refills | Status: AC

## 2023-08-30 NOTE — Telephone Encounter (Signed)
 Per protocol, pt requires tx with metronidazole. Attempted to reach patient x1. VM full.  Rx sent to pharmacy on file.

## 2023-09-24 ENCOUNTER — Ambulatory Visit (HOSPITAL_COMMUNITY)
Admission: RE | Admit: 2023-09-24 | Discharge: 2023-09-24 | Disposition: A | Discharge status: Home / Self Care | Source: Ambulatory Visit | Attending: Internal Medicine | Admitting: Internal Medicine

## 2023-09-24 ENCOUNTER — Encounter (HOSPITAL_COMMUNITY): Payer: Self-pay

## 2023-09-24 VITALS — BP 145/91 | HR 81 | Temp 99.2°F | Resp 16

## 2023-09-24 DIAGNOSIS — Z113 Encounter for screening for infections with a predominantly sexual mode of transmission: Secondary | ICD-10-CM | POA: Diagnosis present

## 2023-09-24 DIAGNOSIS — N5082 Scrotal pain: Secondary | ICD-10-CM | POA: Insufficient documentation

## 2023-09-24 NOTE — Discharge Instructions (Signed)
 Please follow-up with [facility] to discuss scrotal pain further.  Increase your water intake, decrease intake of urinary irritants such as sodas and tea/energy drinks.  STD testing is pending.  Staff will call if any results are positive.

## 2023-09-24 NOTE — ED Triage Notes (Signed)
 Patient was here last month for Stds and wanted to follow up. Patient is still having some soreness in his scrotum.

## 2023-09-24 NOTE — ED Provider Notes (Signed)
 [facility]    CSN: [id number] Arrival date & time: 09/24/23  1249      History   Chief Complaint Chief Complaint  Patient presents with   SEXUALLY TRANSMITTED DISEASE    HPI Patrick Kennedy is a 43 y.o. male.   Patient presents to urgent care for evaluation of ongoing scrotal soreness and urinary frequency that started approximately 1 to 2 months ago.  He is sexually active with male partner unprotected and was tested for STDs 1 month ago where he tested positive for trichomonas.  He took all of his antibiotic as prescribed and is here today to make sure that the infection has resolved.  His scrotal pain and urinary frequency improved after taking Flagyl  for the trichomonas slightly but has returned in the last few weeks.  He states scrotum hurts worse when he is sitting down and putting pressure on it.  Denies change in pain with position changes.  He states his urine is "dark yellow" and he drinks soda frequently.  He admits to poor water intake and states he is attempting to improve this.  Denies use of SGLT2 inhibitor, dysuria, abdominal pain, flank pain, nausea, vomiting, fever, chills, dizziness, and headache.  Denies penile discharge, penile itching, gross hematuria, and penile rash.  He has never been evaluated by a urologist for scrotal discomfort.     Past Medical History:  Diagnosis Date   Hypertension     There are no active problems to display for this patient.   Past Surgical History:  Procedure Laterality Date   DENTAL SURGERY         Home Medications    Prior to Admission medications   Not on File    Family History Family History  Problem Relation Age of Onset   Hypertension Mother    Hypertension Father     Social History Social History   Tobacco Use   Smoking status: Former    Current packs/day: 0.00    Types: Cigarettes    Quit date: 07/29/2017    Years since quitting: 6.1   Smokeless tobacco: Never  Vaping Use    Vaping status: Never Used  Substance Use Topics   Alcohol use: Yes    Comment: 4-5 times a week.    Drug use: No     Allergies   Patient has no known allergies.   Review of Systems Review of Systems Per HPI  Physical Exam Triage Vital Signs ED Triage Vitals [09/24/23 1311]  Encounter Vitals Group     BP (!) 145/91     Systolic BP Percentile      Diastolic BP Percentile      Pulse Rate 81     Resp 16     Temp 99.2 F (37.3 C)     Temp Source Oral     SpO2 95 %     Weight      Height      Head Circumference      Peak Flow      Pain Score 3     Pain Loc      Pain Education      Exclude from Growth Chart    No data found.  Updated Vital Signs BP (!) 145/91 (BP Location: Right Arm)   Pulse 81   Temp 99.2 F (37.3 C) (Oral)   Resp 16   SpO2 95%   Visual Acuity Right Eye Distance:   Left Eye Distance:   Bilateral Distance:  Right Eye Near:   Left Eye Near:    Bilateral Near:     Physical Exam Vitals and nursing note reviewed. Exam conducted with a chaperone present Archibald Beard, RN present for GU exam).  Constitutional:      Appearance: He is not ill-appearing or toxic-appearing.  HENT:     Head: Normocephalic and atraumatic.     Right Ear: Hearing and external ear normal.     Left Ear: Hearing and external ear normal.     Nose: Nose normal.     Mouth/Throat:     Lips: Pink.  Eyes:     General: Lids are normal. Vision grossly intact. Gaze aligned appropriately.     Extraocular Movements: Extraocular movements intact.     Conjunctiva/sclera: Conjunctivae normal.  Pulmonary:     Effort: Pulmonary effort is normal.  Abdominal:     Hernia: There is no hernia in the left inguinal area or right inguinal area.  Genitourinary:    Penis: Normal and circumcised.      Testes: Normal.        Right: Mass, tenderness, swelling, testicular hydrocele or varicocele not present. Right testis is descended.        Left: Mass, tenderness, swelling, testicular  hydrocele or varicocele not present. Left testis is descended.     Epididymis:     Right: Normal.     Left: Normal.  Musculoskeletal:     Cervical back: Neck supple.  Lymphadenopathy:     Lower Body: No right inguinal adenopathy. No left inguinal adenopathy.  Skin:    General: Skin is warm and dry.     Capillary Refill: Capillary refill takes less than 2 seconds.     Findings: No rash.  Neurological:     General: No focal deficit present.     Mental Status: He is alert and oriented to person, place, and time. Mental status is at baseline.     Cranial Nerves: No dysarthria or facial asymmetry.  Psychiatric:        Mood and Affect: Mood normal.        Speech: Speech normal.        Behavior: Behavior normal.        Thought Content: Thought content normal.        Judgment: Judgment normal.      UC Treatments / Results  Labs (all labs ordered are listed, but only abnormal results are displayed) Labs Reviewed  CYTOLOGY, (ORAL, ANAL, URETHRAL) ANCILLARY ONLY    EKG   Radiology No results found.  Procedures Procedures (including critical care time)  Medications Ordered in UC Medications - No data to display  Initial Impression / Assessment and Plan / UC Course  I have reviewed the triage vital signs and the nursing notes.  Pertinent labs & imaging results that were available during my care of the patient were reviewed by me and considered in my medical decision making (see chart for details).   1.  Screening for STD, scrotal pain Repeat STDs testing is pending, staff will call if results are abnormal. GU exam is normal today. I would like for him to follow-up with urology to discuss ongoing scrotal pain. Low suspicion for testicular emergency. Advised to increase intake of water and reduce intake of urinary irritants to improve urinary frequency.  Counseled patient on potential for adverse effects with medications prescribed/recommended today, strict ER and  return-to-clinic precautions discussed, patient verbalized understanding.    Final Clinical Impressions(s) / UC Diagnoses  Final diagnoses:  Screening for STD (sexually transmitted disease)  Scrotal pain     Discharge Instructions      Please follow-up with [facility] to discuss scrotal pain further.  Increase your water intake, decrease intake of urinary irritants such as sodas and tea/energy drinks.  STD testing is pending.  Staff will call if any results are positive.    ED Prescriptions   None    PDMP not reviewed this encounter.   Starlene Eaton, [place] 09/24/23 1423

## 2023-09-25 LAB — CYTOLOGY, (ORAL, ANAL, URETHRAL) ANCILLARY ONLY
Chlamydia: NEGATIVE
Comment: NEGATIVE
Comment: NEGATIVE
Comment: NORMAL
Neisseria Gonorrhea: NEGATIVE
Trichomonas: NEGATIVE

## 2023-10-21 ENCOUNTER — Encounter (HOSPITAL_COMMUNITY): Payer: Self-pay

## 2023-10-21 ENCOUNTER — Ambulatory Visit (HOSPITAL_COMMUNITY)
Admission: RE | Admit: 2023-10-21 | Discharge: 2023-10-21 | Disposition: A | Discharge status: Home / Self Care | Source: Ambulatory Visit | Attending: Neurology | Admitting: Neurology

## 2023-10-21 VITALS — BP 163/83 | HR 88 | Temp 98.3°F | Resp 14

## 2023-10-21 DIAGNOSIS — Z202 Contact with and (suspected) exposure to infections with a predominantly sexual mode of transmission: Secondary | ICD-10-CM | POA: Diagnosis present

## 2023-10-21 NOTE — ED Triage Notes (Signed)
 Pt denies sxs. States he was told by sexual partner that she feels she may have an STI; pt wishes to be checked.

## 2023-10-21 NOTE — ED Provider Notes (Addendum)
 [facility]    CSN: [id number] Arrival date & time: 10/21/23  1601      History   Chief Complaint Chief Complaint  Patient presents with   Exposure to STD    Appt 1630    HPI Patrick Kennedy is a 43 y.o. male. He is sexually active with male partner unprotected and was tested for STDs 2 month ago where he tested positive for trichomonas. He took all of his antibiotic as prescribed. He is her today for testing as his partner stated she felt like she may have an STI so he wanted to be check. He denies symptoms at this time. He is requesting HIV and syphilis testing as well, however he states he is a hard stick and only wants the tech to try once.    Exposure to STD    Past Medical History:  Diagnosis Date   Hypertension     There are no active problems to display for this patient.   Past Surgical History:  Procedure Laterality Date   DENTAL SURGERY         Home Medications    Prior to Admission medications   Not on File    Family History Family History  Problem Relation Age of Onset   Hypertension Mother    Hypertension Father     Social History Social History   Tobacco Use   Smoking status: Former    Current packs/day: 0.00    Types: Cigarettes    Quit date: 07/29/2017    Years since quitting: 6.2   Smokeless tobacco: Never  Vaping Use   Vaping status: Never Used  Substance Use Topics   Alcohol use: Yes    Comment: 3 times a week.   Drug use: No     Allergies   Patient has no known allergies.   Review of Systems Review of Systems   Physical Exam Triage Vital Signs ED Triage Vitals  Encounter Vitals Group     BP 10/21/23 1624 (!) 163/83     Systolic BP Percentile --      Diastolic BP Percentile --      Pulse Rate 10/21/23 1624 88     Resp 10/21/23 1624 14     Temp 10/21/23 1624 98.3 F (36.8 C)     Temp Source 10/21/23 1624 Oral     SpO2 10/21/23 1624 95 %     Weight --      Height --      Head  Circumference --      Peak Flow --      Pain Score 10/21/23 1626 0     Pain Loc --      Pain Education --      Exclude from Growth Chart --    No data found.  Updated Vital Signs BP (!) 163/83   Pulse 88   Temp 98.3 F (36.8 C) (Oral)   Resp 14   SpO2 95%   Visual Acuity Right Eye Distance:   Left Eye Distance:   Bilateral Distance:    Right Eye Near:   Left Eye Near:    Bilateral Near:     Physical Exam Vitals and nursing note reviewed.  Constitutional:      General: He is not in acute distress.    Appearance: He is well-developed.  HENT:     Head: Normocephalic and atraumatic.  Eyes:     Conjunctiva/sclera: Conjunctivae normal.  Cardiovascular:     Rate  and Rhythm: Normal rate and regular rhythm.  Pulmonary:     Effort: Pulmonary effort is normal. No respiratory distress.  Abdominal:     General: Abdomen is flat.     Palpations: Abdomen is soft.  Musculoskeletal:        General: No swelling.     Cervical back: Neck supple.  Skin:    General: Skin is warm and dry.  Neurological:     Mental Status: He is alert.  Psychiatric:        Mood and Affect: Mood normal.      UC Treatments / Results  Labs (all labs ordered are listed, but only abnormal results are displayed) Labs Reviewed  RPR  HIV ANTIBODY (ROUTINE TESTING W REFLEX)  CYTOLOGY, (ORAL, ANAL, URETHRAL) ANCILLARY ONLY    EKG   Radiology No results found.  Procedures Procedures (including critical care time)  Medications Ordered in UC Medications - No data to display  Initial Impression / Assessment and Plan / UC Course  I have reviewed the triage vital signs and the nursing notes.  Pertinent labs & imaging results that were available during my care of the patient were reviewed by me and considered in my medical decision making (see chart for details).  STI labs pending, will notify patient of positive results and treat accordingly per protocol when labs result.  Patient requested  HIV and syphilis testing today.   Patient to avoid sexual intercourse until screening testing comes back.   Education provided regarding safe sexual practices and patient encouraged to use protection to prevent spread of STIs.    Unable to obtain blood for HIV and syphilis testing.  Order canceled as patient did not wish to wait for a nurse to try to obtain his labs.  Order canceled Final Clinical Impressions(s) / UC Diagnoses   Final diagnoses:  STD exposure     Discharge Instructions      STD testing pending, this will take 2-3 days to result. We will only call you if your testing is positive for any infection(s) and we will provide treatment.  Avoid sexual intercourse until your STD results come back.  If any of your STD results are positive, you will need to avoid sexual intercourse for 7 days while you are being treated to prevent spread of STD.  Condom use is the best way to prevent spread of STDs. Notify partner(s) of any positive results.  Return to urgent care as needed.       ED Prescriptions   None    PDMP not reviewed this encounter.   Imogene Mana, NP 10/21/23 1640    Imogene Mana, NP 10/21/23 [phone]

## 2023-10-21 NOTE — Discharge Instructions (Addendum)

## 2023-10-22 LAB — CYTOLOGY, (ORAL, ANAL, URETHRAL) ANCILLARY ONLY
Chlamydia: NEGATIVE
Comment: NEGATIVE
Comment: NEGATIVE
Comment: NORMAL
Neisseria Gonorrhea: NEGATIVE
Trichomonas: POSITIVE — AB

## 2023-10-23 ENCOUNTER — Ambulatory Visit: Payer: Self-pay

## 2023-10-23 MED ORDER — METRONIDAZOLE 500 MG PO TABS: 2000.0000 mg | ORAL_TABLET | Freq: Once | ORAL | 0 refills | Status: AC

## 2023-11-19 ENCOUNTER — Other Ambulatory Visit: Payer: Self-pay

## 2023-11-19 ENCOUNTER — Emergency Department (HOSPITAL_COMMUNITY)
Admission: EM | Admit: 2023-11-19 | Discharge: 2023-11-20 | Disposition: A | Discharge status: Home / Self Care | Attending: Emergency Medicine | Admitting: Emergency Medicine

## 2023-11-19 ENCOUNTER — Emergency Department (HOSPITAL_COMMUNITY)

## 2023-11-19 ENCOUNTER — Encounter (HOSPITAL_COMMUNITY): Payer: Self-pay | Admitting: Emergency Medicine

## 2023-11-19 DIAGNOSIS — R079 Chest pain, unspecified: Secondary | ICD-10-CM

## 2023-11-19 DIAGNOSIS — I10 Essential (primary) hypertension: Secondary | ICD-10-CM | POA: Diagnosis not present

## 2023-11-19 DIAGNOSIS — R0789 Other chest pain: Secondary | ICD-10-CM | POA: Insufficient documentation

## 2023-11-19 LAB — CBC WITH DIFFERENTIAL/PLATELET
Abs Immature Granulocytes: 0.01 10*3/uL (ref 0.00–0.07)
Basophils Absolute: 0 10*3/uL (ref 0.0–0.1)
Basophils Relative: 1 %
Eosinophils Absolute: 0.1 10*3/uL (ref 0.0–0.5)
Eosinophils Relative: 2 %
HCT: 48.1 % (ref 39.0–52.0)
Hemoglobin: 16.3 g/dL (ref 13.0–17.0)
Immature Granulocytes: 0 %
Lymphocytes Relative: 37 %
Lymphs Abs: 2.1 10*3/uL (ref 0.7–4.0)
MCH: 29.5 pg (ref 26.0–34.0)
MCHC: 33.9 g/dL (ref 30.0–36.0)
MCV: 87 fL (ref 80.0–100.0)
Monocytes Absolute: 0.3 10*3/uL (ref 0.1–1.0)
Monocytes Relative: 5 %
Neutro Abs: 3.2 10*3/uL (ref 1.7–7.7)
Neutrophils Relative %: 55 %
Platelets: 222 10*3/uL (ref 150–400)
RBC: 5.53 MIL/uL (ref 4.22–5.81)
RDW: 12.3 % (ref 11.5–15.5)
WBC: 5.7 10*3/uL (ref 4.0–10.5)
nRBC: 0 % (ref 0.0–0.2)

## 2023-11-19 LAB — BASIC METABOLIC PANEL WITH GFR
Anion gap: 14 (ref 5–15)
BUN: 8 mg/dL (ref 6–20)
CO2: 21 mmol/L — ABNORMAL LOW (ref 22–32)
Calcium: 9 mg/dL (ref 8.9–10.3)
Chloride: 104 mmol/L (ref 98–111)
Creatinine, Ser: 1.17 mg/dL (ref 0.61–1.24)
GFR, Estimated: >60 mL/min (ref >60)
Glucose, Bld: 126 mg/dL — ABNORMAL HIGH (ref 70–99)
Potassium: 3.7 mmol/L (ref 3.5–5.1)
Sodium: 139 mmol/L (ref 135–145)

## 2023-11-19 LAB — TROPONIN I (HIGH SENSITIVITY)
Troponin I (High Sensitivity): 3 ng/L (ref <18)
Troponin I (High Sensitivity): 4 ng/L (ref <18)

## 2023-11-19 NOTE — ED Provider Triage Note (Signed)
 Emergency Medicine Provider Triage Evaluation Note  Nereida Banning II , a 43 y.o. male  was evaluated in triage.  Pt complains of chest pain.  Review of Systems  Positive: Chest pain Negative: Fever, cough  Physical Exam  BP (!) 175/98 (BP Location: Right Arm)   Pulse 86   Temp 98.5 F (36.9 C)   Resp 17   Ht 6' 4 (1.93 m)   Wt 102.1 kg   SpO2 98%   BMI 27.39 kg/m  Gen:   Awake, no distress   Resp:  Normal effort  MSK:   Moves extremities without difficulty  Other:    Medical Decision Making  Medically screening exam initiated at 7:48 PM.  Appropriate orders placed.  Nereida Banning II was informed that the remainder of the evaluation will be completed by another provider, this initial triage assessment does not replace that evaluation, and the importance of remaining in the ED until their evaluation is complete.  Patient with onset chest pain over the last several days. Thought it was indigestion but I also want to be checked for STD's.   Mandy Second, PA-C 11/19/23 1949

## 2023-11-19 NOTE — ED Triage Notes (Signed)
 Patient c/o chest tightness and fatigue x 1 week.  Patient denies n/v/sob. Patient gives verbal consent for MSE.

## 2023-11-20 LAB — URINALYSIS, ROUTINE W REFLEX MICROSCOPIC
Bilirubin Urine: NEGATIVE
Glucose, UA: NEGATIVE mg/dL
Hgb urine dipstick: NEGATIVE
Ketones, ur: NEGATIVE mg/dL
Leukocytes,Ua: NEGATIVE
Nitrite: NEGATIVE
Protein, ur: NEGATIVE mg/dL
Specific Gravity, Urine: 1.02 (ref 1.005–1.030)
pH: 5 (ref 5.0–8.0)

## 2023-11-20 MED ORDER — PANTOPRAZOLE SODIUM 40 MG PO TBEC: 40.0000 mg | DELAYED_RELEASE_TABLET | Freq: Every day | ORAL | 3 refills | Status: DC

## 2023-11-20 MED ORDER — IBUPROFEN 600 MG PO TABS: 600.0000 mg | ORAL_TABLET | Freq: Four times a day (QID) | ORAL | 0 refills | Status: DC | PRN

## 2023-11-20 NOTE — ED Notes (Signed)
 Patient discharged in stable condition, education materials explained including, follow up, any prescriptions and reasons to return. Patient voiced agreement to education and discharge material.

## 2023-11-20 NOTE — ED Provider Notes (Signed)
 [place] [facility] AT [facility] Provider Note   CSN: [id number] Arrival date & time: 11/19/23  1911     Patient presents with: Chest Pain   Patrick Kennedy is a 43 y.o. male.   Presents to the emergency department with complaints of chest discomfort.  He reports tightness in the chest and feeling weak for the last week.       Prior to Admission medications   Medication Sig Start Date End Date Taking? Authorizing Provider  ibuprofen  (ADVIL ) 600 MG tablet Take 1 tablet (600 mg total) by mouth every 6 (six) hours as needed. 11/20/23  Yes Divya Munshi, Marine Sia, MD  pantoprazole (PROTONIX) 40 MG tablet Take 1 tablet (40 mg total) by mouth daily. 11/20/23  Yes Dia Jefferys, Marine Sia, MD    Allergies: Patient has no known allergies.    Review of Systems  Updated Vital Signs BP (!) 162/108 (BP Location: Left Arm)   Pulse 81   Temp 98.7 F (37.1 C)   Resp 16   Ht 6' 4 (1.93 m)   Wt 102.1 kg   SpO2 100%   BMI 27.39 kg/m   Physical Exam Vitals and nursing note reviewed.  Constitutional:      General: He is not in acute distress.    Appearance: He is well-developed.  HENT:     Head: Normocephalic and atraumatic.     Mouth/Throat:     Mouth: Mucous membranes are moist.   Eyes:     General: Vision grossly intact. Gaze aligned appropriately.     Extraocular Movements: Extraocular movements intact.     Conjunctiva/sclera: Conjunctivae normal.    Cardiovascular:     Rate and Rhythm: Normal rate and regular rhythm.     Pulses: Normal pulses.     Heart sounds: Normal heart sounds, S1 normal and S2 normal. No murmur heard.    No friction rub. No gallop.  Pulmonary:     Effort: Pulmonary effort is normal. No respiratory distress.     Breath sounds: Normal breath sounds.  Abdominal:     Palpations: Abdomen is soft.     Tenderness: There is no abdominal tenderness. There is no guarding (LVH) or rebound.     Hernia: No hernia is present.    Musculoskeletal:        General: No swelling.     Cervical back: Full passive range of motion without pain, normal range of motion and neck supple. No pain with movement, spinous process tenderness or muscular tenderness. Normal range of motion.     Right lower leg: No edema.     Left lower leg: No edema.   Skin:    General: Skin is warm and dry.     Capillary Refill: Capillary refill takes less than 2 seconds.     Findings: No ecchymosis, erythema, lesion or wound.   Neurological:     Mental Status: He is alert and oriented to person, place, and time.     GCS: GCS eye subscore is 4. GCS verbal subscore is 5. GCS motor subscore is 6.     Cranial Nerves: Cranial nerves 2-12 are intact.     Sensory: Sensation is intact.     Motor: Motor function is intact. No weakness or abnormal muscle tone.     Coordination: Coordination is intact.   Psychiatric:        Mood and Affect: Mood normal.        Speech: Speech normal.  Behavior: Behavior normal.     (all labs ordered are listed, but only abnormal results are displayed) Labs Reviewed  BASIC METABOLIC PANEL WITH GFR - Abnormal; Notable for the following components:      Result Value   CO2 21 (*)    Glucose, Bld 126 (*)    All other components within normal limits  CBC WITH DIFFERENTIAL/PLATELET  URINALYSIS, ROUTINE W REFLEX MICROSCOPIC  GC/CHLAMYDIA PROBE AMP ([place]) NOT AT [facility]  TROPONIN I (HIGH SENSITIVITY)  TROPONIN I (HIGH SENSITIVITY)    EKG: None   ED ECG REPORT   Date: 11/20/2023  Rate: 86  Rhythm: normal sinus rhythm  QRS Axis: normal  Intervals: normal  ST/T Wave abnormalities: nonspecific ST changes and LVH  Conduction Disutrbances:none  Narrative Interpretation:   Old EKG Reviewed: unchanged  I have personally reviewed the EKG tracing and agree with the computerized printout as noted.   Radiology: DG Chest 2 View Result Date: 11/19/2023 EXAM: 2 VIEW(S) XRAY OF THE CHEST 11/19/2023  08:14:00 PM COMPARISON: 06/09/2020 CLINICAL HISTORY: CP. Patient notes chest tightness for 1 week. He notes no SOB or cough. He notes this pain has been bilateral. FINDINGS: LUNGS AND PLEURA: No focal pulmonary opacity. No pulmonary edema. No pleural effusion. No pneumothorax. HEART AND MEDIASTINUM: No acute abnormality of the cardiac and mediastinal silhouettes. BONES AND SOFT TISSUES: No acute osseous abnormality. IMPRESSION: 1. No acute process. Electronically signed by: Zadie Herter MD 11/19/2023 08:21 PM EDT RP Workstation: [id number]     Procedures   Medications Ordered in the ED - No data to display                                  Medical Decision Making Amount and/or Complexity of Data Reviewed External Data Reviewed: ECG. Labs: ordered. Decision-making details documented in ED Course. Radiology: ordered and independent interpretation performed. Decision-making details documented in ED Course. ECG/medicine tests: ordered and independent interpretation performed. Decision-making details documented in ED Course.   Differential Diagnosis considered includes, but not limited to: STEMI; NSTEMI; myocarditis; pericarditis; pulmonary embolism; aortic dissection; pneumothorax; pneumonia; gastritis; musculoskeletal pain  Patient experiencing chest pains ongoing for 1 week.  He reports that the pains have been moving around but this morning he had a tightness across the center of his chest which became concerning and caused him to finally come to the emergency department.  No associated shortness of breath.  Patient does have a history of hypertension.  No known CAD.  No early heart disease in his family.  Pain does not occur with exertion.  He reports predominantly sharp pains.  This presentation is atypical for cardiac etiology.  His workup here has been reassuring.  EKG with changes of LVH but no ischemia.  Troponin negative x 2.  Chest x-ray clear, no volume overload or other  abnormalities.  At this point patient is safe for discharge, follow-up with cardiology to complete workup.     Final diagnoses:  Chest pain, unspecified type    ED Discharge Orders          Ordered    pantoprazole (PROTONIX) 40 MG tablet  Daily        11/20/23 0225    ibuprofen  (ADVIL ) 600 MG tablet  Every 6 hours PRN        11/20/23 0225    Ambulatory referral to Cardiology       Comments: If you have not  heard from the Cardiology office within the next 72 hours please call [phone].   11/20/23 3664               Ballard Bongo, MD 11/20/23 [phone]

## 2023-11-21 LAB — GC/CHLAMYDIA PROBE AMP (~~LOC~~) NOT AT ARMC
Chlamydia: NEGATIVE
Comment: NEGATIVE
Comment: NORMAL
Neisseria Gonorrhea: NEGATIVE

## 2023-12-30 ENCOUNTER — Ambulatory Visit (HOSPITAL_COMMUNITY): Admission: RE | Admit: 2023-12-30 | Discharge: 2023-12-30 | Disposition: A | Discharge status: Home / Self Care

## 2023-12-30 ENCOUNTER — Encounter (HOSPITAL_COMMUNITY): Payer: Self-pay

## 2023-12-30 VITALS — BP 164/105 | HR 99 | Temp 98.5°F | Resp 18

## 2023-12-30 DIAGNOSIS — R143 Flatulence: Secondary | ICD-10-CM | POA: Insufficient documentation

## 2023-12-30 DIAGNOSIS — R1084 Generalized abdominal pain: Secondary | ICD-10-CM | POA: Insufficient documentation

## 2023-12-30 DIAGNOSIS — R300 Dysuria: Secondary | ICD-10-CM | POA: Insufficient documentation

## 2023-12-30 LAB — POCT URINALYSIS DIP (MANUAL ENTRY)
Bilirubin, UA: NEGATIVE
Blood, UA: NEGATIVE
Glucose, UA: NEGATIVE mg/dL
Ketones, POC UA: NEGATIVE mg/dL
Leukocytes, UA: NEGATIVE
Nitrite, UA: NEGATIVE
Protein Ur, POC: NEGATIVE mg/dL
Spec Grav, UA: 1.025 (ref 1.010–1.025)
Urobilinogen, UA: 0.2 U/dL
pH, UA: 5.5 (ref 5.0–8.0)

## 2023-12-30 MED ORDER — SIMETHICONE 40 MG/0.6ML PO SUSP: 40.0000 mg | Freq: Four times a day (QID) | ORAL | 0 refills | Status: DC | PRN

## 2023-12-30 NOTE — ED Provider Notes (Signed)
 [facility]  Note:  This document was prepared using Dragon voice recognition software and may include unintentional dictation errors.  MRN: [id number] DOB: 01/01/1981  Subjective:   Patrick Kennedy is a 43 y.o. male presenting for generalized abdominal pressure and bloating with increased flatulence and occasional constipation.  Patient reports he gets constipated and has increased flatulence when taking pantoprazole  as prescribed for acid reflux.  Patient denies any nausea/vomiting, diarrhea, severe localized abdominal pain, blood in stool or vomit, chest pain, shortness of breath, weakness, dizziness, fatigue.  Patient denies any recent abdominal trauma or injury.  Patient also reports mild dysuria intermittently and would like STD testing and urinalysis to make sure he does not have a UTI or STD.  Patient denies any increased urinary frequency, flank pain, lower abdominal pain, or known exposure to STD.  No current facility-administered medications for this encounter.  Current Outpatient Medications:    simethicone  (MYLICON) 40 MG/0.6ML drops, Take 0.6 mLs (40 mg total) by mouth 4 (four) times daily as needed for flatulence., Disp: 30 mL, Rfl: 0   ibuprofen  (ADVIL ) 600 MG tablet, Take 1 tablet (600 mg total) by mouth every 6 (six) hours as needed., Disp: 30 tablet, Rfl: 0   pantoprazole  (PROTONIX ) 40 MG tablet, Take 1 tablet (40 mg total) by mouth daily., Disp: 30 tablet, Rfl: 3   No Known Allergies  Past Medical History:  Diagnosis Date   Hypertension      Past Surgical History:  Procedure Laterality Date   DENTAL SURGERY      Family History  Problem Relation Age of Onset   Hypertension Mother    Hypertension Father     Social History   Tobacco Use   Smoking status: Former    Current packs/day: 0.00    Types: Cigarettes    Quit date: 07/29/2017    Years since quitting: 6.4   Smokeless tobacco: Never  Vaping Use   Vaping status: Never Used   Substance Use Topics   Alcohol use: Yes    Comment: 3 times a week.   Drug use: No    ROS Refer to HPI for ROS details.  Objective:   Vitals: BP (!) 164/105 (BP Location: Left Arm)   Pulse 99   Temp 98.5 F (36.9 C) (Oral)   Resp 18   SpO2 97%   Physical Exam Vitals and nursing note reviewed.  Constitutional:      General: He is not in acute distress.    Appearance: Normal appearance. He is well-developed. He is not ill-appearing or toxic-appearing.  HENT:     Head: Normocephalic.  Cardiovascular:     Rate and Rhythm: Normal rate.  Pulmonary:     Effort: Pulmonary effort is normal. No respiratory distress.  Abdominal:     General: There is distension.     Palpations: Abdomen is soft.     Tenderness: There is abdominal tenderness. There is no right CVA tenderness, left CVA tenderness, guarding or rebound.  Skin:    General: Skin is warm and dry.  Neurological:     General: No focal deficit present.     Mental Status: He is alert and oriented to person, place, and time.  Psychiatric:        Mood and Affect: Mood normal.        Behavior: Behavior normal.     Procedures  Results for orders placed or performed during the hospital encounter of 12/30/23 (from the past 24 hours)  POC  urinalysis dipstick     Status: None   Collection Time: 12/30/23 10:26 AM  Result Value Ref Range   Color, UA yellow yellow   Clarity, UA clear clear   Glucose, UA negative negative mg/dL   Bilirubin, UA negative negative   Ketones, POC UA negative negative mg/dL   Spec Grav, UA 8.974 8.989 - 1.025   Blood, UA negative negative   pH, UA 5.5 5.0 - 8.0   Protein Ur, POC negative negative mg/dL   Urobilinogen, UA 0.2 0.2 or 1.0 E.U./dL   Nitrite, UA Negative Negative   Leukocytes, UA Negative Negative    No results found.   Assessment and Plan :     Discharge Instructions       1. Generalized abdominal pain (Primary) 2. Flatulence - simethicone  (MYLICON) 40 MG/0.6ML  drops; Take 0.6 mLs (40 mg total) by mouth 4 (four) times daily as needed for flatulence.  Dispense: 30 mL; Refill: 0 - Continue taking previously prescribed pantoprazole  as directed for GERD symptoms. - If you continue to have constipation symptoms take MiraLAX 17 g and a glass of water or juice 1-2 times weekly to help maintain regular bowel movements and prevent constipation.  3. Dysuria - POC urinalysis dipstick completed in UC shows no leukocytes, no nitrite, no blood, no sign of urinary tract infection as the cause of dysuria. - Cytology swab-Urethral; GC / Chlamydia, Trichomonas collected in UC and sent to lab for further testing results should be available in 2 to 3 days. -Continue to monitor symptoms for any change in severity if there is any escalation of current symptoms or development of new symptoms follow-up in ER for further evaluation and management.      Sharayah Renfrow B Landin Tallon   Lajada Janes, [place], [place] 12/30/23 1102

## 2023-12-30 NOTE — ED Triage Notes (Signed)
 Pt states was told his partner has a UTI a week ago and since then having abdominal pain and burning on urination x2 days. States he was told to get STD testing.   States has been on Protonix  for a few months and it helps with heart burn but nothing for gas. States it also constipates him.

## 2023-12-30 NOTE — Discharge Instructions (Signed)
  1. Generalized abdominal pain (Primary) 2. Flatulence - simethicone  (MYLICON) 40 MG/0.6ML drops; Take 0.6 mLs (40 mg total) by mouth 4 (four) times daily as needed for flatulence.  Dispense: 30 mL; Refill: 0 - Continue taking previously prescribed pantoprazole  as directed for GERD symptoms. - If you continue to have constipation symptoms take MiraLAX 17 g and a glass of water or juice 1-2 times weekly to help maintain regular bowel movements and prevent constipation.  3. Dysuria - POC urinalysis dipstick completed in UC shows no leukocytes, no nitrite, no blood, no sign of urinary tract infection as the cause of dysuria. - Cytology swab-Urethral; GC / Chlamydia, Trichomonas collected in UC and sent to lab for further testing results should be available in 2 to 3 days. -Continue to monitor symptoms for any change in severity if there is any escalation of current symptoms or development of new symptoms follow-up in ER for further evaluation and management.

## 2023-12-31 LAB — CYTOLOGY, (ORAL, ANAL, URETHRAL) ANCILLARY ONLY
Chlamydia: NEGATIVE
Comment: NEGATIVE
Comment: NEGATIVE
Comment: NORMAL
Neisseria Gonorrhea: NEGATIVE
Trichomonas: NEGATIVE

## 2024-02-11 ENCOUNTER — Encounter (HOSPITAL_COMMUNITY): Payer: Self-pay

## 2024-02-11 ENCOUNTER — Ambulatory Visit (HOSPITAL_COMMUNITY)
Admission: RE | Admit: 2024-02-11 | Discharge: 2024-02-11 | Disposition: A | Discharge status: Home / Self Care | Source: Ambulatory Visit | Attending: Emergency Medicine | Admitting: Emergency Medicine

## 2024-02-11 VITALS — BP 170/98 | HR 97 | Temp 98.5°F | Resp 18

## 2024-02-11 DIAGNOSIS — R300 Dysuria: Secondary | ICD-10-CM

## 2024-02-11 DIAGNOSIS — Z113 Encounter for screening for infections with a predominantly sexual mode of transmission: Secondary | ICD-10-CM | POA: Diagnosis not present

## 2024-02-11 LAB — POCT URINALYSIS DIP (MANUAL ENTRY)
Bilirubin, UA: NEGATIVE
Blood, UA: NEGATIVE
Glucose, UA: NEGATIVE mg/dL
Ketones, POC UA: NEGATIVE mg/dL
Leukocytes, UA: NEGATIVE
Nitrite, UA: NEGATIVE
Protein Ur, POC: NEGATIVE mg/dL
Spec Grav, UA: >=1.03 — AB (ref 1.010–1.025)
Urobilinogen, UA: 0.2 U/dL
pH, UA: 5.5 (ref 5.0–8.0)

## 2024-02-11 NOTE — ED Provider Notes (Signed)
 [facility]    CSN: [id number] Arrival date & time: 02/11/24  1528      History   Chief Complaint Chief Complaint  Patient presents with   SEXUALLY TRANSMITTED DISEASE    HPI MARKEEM NOREEN II is a 43 y.o. male.   Patient presents requesting STD testing.  Patient states that he was having sexual intercourse today when the condom broke and he is now concerned that he may have an STD.  Patient denies any known exposures to STDs.  Patient does report that he has had some dysuria and some intermittent lower abdominal discomfort that began shortly after sexual intercourse today as well.  Denies hematuria, urinary frequency/urgency, flank pain, fever, nausea, vomiting, and diarrhea.  Patient also denies any penile discharge, penile/testicular pain or swelling, and penile lesions.  The history is provided by the patient and medical records.    Past Medical History:  Diagnosis Date   Hypertension     There are no active problems to display for this patient.   Past Surgical History:  Procedure Laterality Date   DENTAL SURGERY         Home Medications    Prior to Admission medications   Medication Sig Start Date End Date Taking? Authorizing Provider  ibuprofen  (ADVIL ) 600 MG tablet Take 1 tablet (600 mg total) by mouth every 6 (six) hours as needed. 11/20/23   Haze Lonni PARAS, MD  pantoprazole  (PROTONIX ) 40 MG tablet Take 1 tablet (40 mg total) by mouth daily. 11/20/23   Haze Lonni PARAS, MD  simethicone  (MYLICON) 40 MG/0.6ML drops Take 0.6 mLs (40 mg total) by mouth 4 (four) times daily as needed for flatulence. 12/30/23   Aurea Ethel NOVAK, NP    Family History Family History  Problem Relation Age of Onset   Hypertension Mother    Hypertension Father     Social History Social History   Tobacco Use   Smoking status: Former    Current packs/day: 0.00    Types: Cigarettes    Quit date: 07/29/2017    Years since quitting: 6.5    Smokeless tobacco: Never  Vaping Use   Vaping status: Never Used  Substance Use Topics   Alcohol use: Yes    Comment: 3 times a week.   Drug use: No     Allergies   Patient has no known allergies.   Review of Systems Review of Systems  Per HPI  Physical Exam Triage Vital Signs ED Triage Vitals [02/11/24 1558]  Encounter Vitals Group     BP (!) 170/98     Girls Systolic BP Percentile      Girls Diastolic BP Percentile      Boys Systolic BP Percentile      Boys Diastolic BP Percentile      Pulse Rate 97     Resp 18     Temp 98.5 F (36.9 C)     Temp Source Oral     SpO2 95 %     Weight      Height      Head Circumference      Peak Flow      Pain Score 0     Pain Loc      Pain Education      Exclude from Growth Chart    No data found.  Updated Vital Signs BP (!) 170/98 (BP Location: Left Arm)   Pulse 97   Temp 98.5 F (36.9 C) (Oral)   Resp  18   SpO2 95%   Visual Acuity Right Eye Distance:   Left Eye Distance:   Bilateral Distance:    Right Eye Near:   Left Eye Near:    Bilateral Near:     Physical Exam Vitals and nursing note reviewed.  Constitutional:      General: He is awake. He is not in acute distress.    Appearance: Normal appearance. He is well-developed and well-groomed. He is not ill-appearing.  Abdominal:     General: Abdomen is flat. Bowel sounds are normal. There is no distension.     Palpations: Abdomen is soft.     Tenderness: There is no abdominal tenderness. There is no right CVA tenderness, left CVA tenderness, guarding or rebound.  Genitourinary:    Comments: Exam deferred Skin:    General: Skin is warm and dry.  Neurological:     Mental Status: He is alert.  Psychiatric:        Behavior: Behavior is cooperative.      UC Treatments / Results  Labs (all labs ordered are listed, but only abnormal results are displayed) Labs Reviewed  POCT URINALYSIS DIP (MANUAL ENTRY) - Abnormal; Notable for the following components:       Result Value   Color, UA light yellow (*)    Spec Grav, UA >=1.030 (*)    All other components within normal limits  URINE CULTURE  CYTOLOGY, (ORAL, ANAL, URETHRAL) ANCILLARY ONLY    EKG   Radiology No results found.  Procedures Procedures (including critical care time)  Medications Ordered in UC Medications - No data to display  Initial Impression / Assessment and Plan / UC Course  I have reviewed the triage vital signs and the nursing notes.  Pertinent labs & imaging results that were available during my care of the patient were reviewed by me and considered in my medical decision making (see chart for details).     Patient is overall well-appearing.  Vitals are stable.  Abdomen is soft and nontender.  GU exam deferred.  Patient performed self swab for STD.  HIV and RPR declined.  Urinalysis unremarkable, will send urine culture to confirm.  Deferred any treatment at this time.  Discussed follow-up and return precautions. Final Clinical Impressions(s) / UC Diagnoses   Final diagnoses:  Dysuria  Screen for STD (sexually transmitted disease)     Discharge Instructions      Your results will come back over the next few days and someone will call if results are positive and require treatment.  Return here as needed.     ED Prescriptions   None    PDMP not reviewed this encounter.   Johnie Flaming A, NP 02/11/24 1723

## 2024-02-11 NOTE — ED Triage Notes (Signed)
 Pt states that he was having sex today and the condom broke so he drove straight to UC. He no sx and he is unaware if the partner has any sx or dx.

## 2024-02-11 NOTE — Discharge Instructions (Signed)
 Your results will come back over the next few days and someone will call if results are positive and require treatment.  Return here as needed.

## 2024-02-12 LAB — URINE CULTURE: Culture: NO GROWTH

## 2024-02-13 ENCOUNTER — Ambulatory Visit (HOSPITAL_COMMUNITY): Payer: Self-pay

## 2024-02-13 LAB — CYTOLOGY, (ORAL, ANAL, URETHRAL) ANCILLARY ONLY
Chlamydia: NEGATIVE
Comment: NEGATIVE
Comment: NEGATIVE
Comment: NORMAL
Neisseria Gonorrhea: NEGATIVE
Trichomonas: NEGATIVE

## 2024-05-15 ENCOUNTER — Ambulatory Visit (HOSPITAL_COMMUNITY)
Admission: RE | Admit: 2024-05-15 | Discharge: 2024-05-15 | Disposition: A | Discharge status: Home / Self Care | Source: Ambulatory Visit

## 2024-05-15 ENCOUNTER — Encounter (HOSPITAL_COMMUNITY): Payer: Self-pay

## 2024-05-15 VITALS — BP 171/97 | HR 98 | Temp 98.4°F | Resp 17

## 2024-05-15 DIAGNOSIS — R300 Dysuria: Secondary | ICD-10-CM | POA: Diagnosis not present

## 2024-05-15 LAB — POCT URINE DIPSTICK
Bilirubin, UA: NEGATIVE
Glucose, UA: NEGATIVE mg/dL
Ketones, POC UA: NEGATIVE mg/dL
Leukocytes, UA: NEGATIVE
Nitrite, UA: NEGATIVE
POC PROTEIN,UA: NEGATIVE
Spec Grav, UA: 1.01 (ref 1.010–1.025)
Urobilinogen, UA: 0.2 U/dL
pH, UA: 6 (ref 5.0–8.0)

## 2024-05-15 MED ORDER — PHENAZOPYRIDINE HCL 200 MG PO TABS: 200.0000 mg | ORAL_TABLET | Freq: Three times a day (TID) | ORAL | 0 refills | Status: AC

## 2024-05-15 NOTE — ED Provider Notes (Signed)
 [facility] [place]  Note:  This document was prepared using Conservation officer, historic buildings and may include unintentional dictation errors.  MRN: [id number] DOB: May 21, 1981  Subjective:   Patrick Kennedy is a 43 y.o. male presenting for evaluation of pain with urination x 4 to 5 days.  Patient reports that he did have sexual intercourse with a male who told him earlier this week that she had a yeast infection when they had intercourse.  Patient denies any hematuria, increased frequency, penile discharge, penile lesions, abdominal pain, flank pain, diarrhea.  Patient denies taking any over-the-counter medication to treat symptoms prior to arrival in urgent care.  Current Medications[1]   Allergies[2]  Past Medical History:  Diagnosis Date   Hypertension      Past Surgical History:  Procedure Laterality Date   DENTAL SURGERY      Family History  Problem Relation Age of Onset   Hypertension Mother    Hypertension Father     Social History[3]  ROS Refer to HPI for ROS details.  Objective:    Vitals: BP (!) 171/97 (BP Location: Left Arm)   Pulse 98   Temp 98.4 F (36.9 C) (Oral)   Resp 17   SpO2 95%   Physical Exam Vitals and nursing note reviewed.  Constitutional:      General: He is not in acute distress.    Appearance: He is well-developed. He is not ill-appearing or toxic-appearing.  HENT:     Head: Normocephalic.  Cardiovascular:     Rate and Rhythm: Normal rate.  Pulmonary:     Effort: Pulmonary effort is normal. No respiratory distress.     Breath sounds: No stridor. No wheezing.  Abdominal:     Tenderness: There is no abdominal tenderness. There is no right CVA tenderness.  Skin:    General: Skin is warm and dry.  Neurological:     General: No focal deficit present.     Mental Status: He is alert and oriented to person, place, and time.  Psychiatric:        Mood and Affect: Mood normal.        Behavior: Behavior normal.      Procedures  Results for orders placed or performed during the hospital encounter of 05/15/24 (from the past 24 hours)  POC Urinalysis Dipstick     Status: Abnormal   Collection Time: 05/15/24 12:01 PM  Result Value Ref Range   Color, UA yellow yellow   Clarity, UA clear clear   Glucose, UA negative negative mg/dL   Bilirubin, UA negative negative   Ketones, POC UA negative negative mg/dL   Spec Grav, UA 8.989 8.989 - 1.025   Blood, UA trace-intact (A) negative   pH, UA 6.0 5.0 - 8.0   POC PROTEIN,UA negative negative, trace   Urobilinogen, UA 0.2 0.2 or 1.0 E.U./dL   Nitrite, UA Negative Negative   Leukocytes, UA Negative Negative    Assessment and Plan :     Discharge Instructions       1. Dysuria (Primary) - Cytology swab - Urethral; GC / Chlamydia, Trichomonas collected in UC and sent to lab for further testing results should be available in 2 to 3 days - POC Urinalysis Dipstick completed in UC shows trace blood, no nitrite, no leukocytes, these findings are indicative of urethral irritation but not indicative of urinary tract infection. - phenazopyridine (PYRIDIUM) 200 MG tablet; Take 1 tablet (200 mg total) by mouth 3 (three) times daily.  Dispense:  6 tablet; Refill: 0 - Urine culture collected and sent to lab for further testing results should be available in 2 to 3 days.  -Continue to monitor symptoms for any change in severity if there is any escalation of current symptoms or development of new symptoms follow-up in ER for further evaluation and management.      Danice Dippolito B Kadesha Virrueta    [1] No current facility-administered medications for this encounter.  Current Outpatient Medications:    phenazopyridine (PYRIDIUM) 200 MG tablet, Take 1 tablet (200 mg total) by mouth 3 (three) times daily., Disp: 6 tablet, Rfl: 0 [2] No Known Allergies [3]  Social History Tobacco Use   Smoking status: Former    Current packs/day: 0.00    Types: Cigarettes    Quit  date: 07/29/2017    Years since quitting: 6.8   Smokeless tobacco: Never  Vaping Use   Vaping status: Never Used  Substance Use Topics   Alcohol use: Yes    Comment: 3 times a week.   Drug use: No     Aurea Ethel NOVAK, NP 05/15/24 1212

## 2024-05-15 NOTE — ED Triage Notes (Signed)
 Pt reports had sexual intercourse with male who had an yeast infection and for about 4-5 days having pain with urination. Denies any blood or drainage or bumps.

## 2024-05-15 NOTE — Discharge Instructions (Addendum)
°  1. Dysuria (Primary) - Cytology swab - Urethral; GC / Chlamydia, Trichomonas collected in UC and sent to lab for further testing results should be available in 2 to 3 days - POC Urinalysis Dipstick completed in UC shows trace blood, no nitrite, no leukocytes, these findings are indicative of urethral irritation but not indicative of urinary tract infection. - phenazopyridine (PYRIDIUM) 200 MG tablet; Take 1 tablet (200 mg total) by mouth 3 (three) times daily.  Dispense: 6 tablet; Refill: 0 - Urine culture collected and sent to lab for further testing results should be available in 2 to 3 days.  -Continue to monitor symptoms for any change in severity if there is any escalation of current symptoms or development of new symptoms follow-up in ER for further evaluation and management.

## 2024-05-16 LAB — URINE CULTURE
Culture: NO GROWTH
Special Requests: NORMAL

## 2024-05-18 ENCOUNTER — Ambulatory Visit (HOSPITAL_COMMUNITY): Payer: Self-pay

## 2024-05-19 LAB — CYTOLOGY, (ORAL, ANAL, URETHRAL) ANCILLARY ONLY
Chlamydia: NEGATIVE
Comment: NEGATIVE
Comment: NEGATIVE
Comment: NORMAL
Neisseria Gonorrhea: NEGATIVE
Trichomonas: POSITIVE — AB

## 2024-05-19 MED ORDER — METRONIDAZOLE 500 MG PO TABS: 2000.0000 mg | ORAL_TABLET | Freq: Once | ORAL | 0 refills | Status: AC
